# Patient Record
Sex: Male | Born: 1935 | Race: White | Hispanic: No | Marital: Married | Smoking: Never smoker
Health system: Southern US, Community
[De-identification: ages and names within clinical notes are randomized; demographics above are authoritative.]

## PROBLEM LIST (undated history)

## (undated) DIAGNOSIS — R972 Elevated prostate specific antigen [PSA]: Secondary | ICD-10-CM

## (undated) HISTORY — DX: Elevated prostate specific antigen (PSA): R97.20

---
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
## 1978-05-08 HISTORY — PX: BACK SURGERY: SHX140

## 2006-05-08 HISTORY — PX: HERNIA REPAIR: SHX51

## 2009-09-07 ENCOUNTER — Ambulatory Visit: Payer: Self-pay | Admitting: General Surgery

## 2009-09-20 ENCOUNTER — Ambulatory Visit: Payer: Self-pay | Admitting: General Surgery

## 2010-11-14 ENCOUNTER — Ambulatory Visit: Payer: Self-pay | Admitting: Gastroenterology

## 2011-11-16 ENCOUNTER — Encounter (INDEPENDENT_AMBULATORY_CARE_PROVIDER_SITE_OTHER): Payer: Self-pay

## 2012-11-19 ENCOUNTER — Ambulatory Visit: Payer: Self-pay | Admitting: Surgery

## 2012-11-19 LAB — CBC WITH DIFFERENTIAL/PLATELET
Basophil #: 0.1 10*3/uL (ref 0.0–0.1)
Basophil %: 0.8 %
Eosinophil #: 0.2 10*3/uL (ref 0.0–0.7)
Eosinophil %: 3.2 %
HCT: 42.1 % (ref 40.0–52.0)
HGB: 14.3 g/dL (ref 13.0–18.0)
Lymphocyte #: 1.7 10*3/uL (ref 1.0–3.6)
MCV: 92 fL (ref 80–100)
Monocyte #: 0.8 x10 3/mm (ref 0.2–1.0)
Monocyte %: 10.5 %
Neutrophil #: 4.5 10*3/uL (ref 1.4–6.5)
Neutrophil %: 61.9 %
RBC: 4.56 10*6/uL (ref 4.40–5.90)
RDW: 13.6 % (ref 11.5–14.5)
WBC: 7.2 10*3/uL (ref 3.8–10.6)

## 2012-11-19 LAB — COMPREHENSIVE METABOLIC PANEL
Bilirubin,Total: 0.5 mg/dL (ref 0.2–1.0)
Calcium, Total: 9.3 mg/dL (ref 8.5–10.1)
Chloride: 105 mmol/L (ref 98–107)
Co2: 31 mmol/L (ref 21–32)
EGFR (Non-African Amer.): 49 — ABNORMAL LOW
Glucose: 81 mg/dL (ref 65–99)
Potassium: 4.6 mmol/L (ref 3.5–5.1)

## 2012-11-22 ENCOUNTER — Ambulatory Visit (INDEPENDENT_AMBULATORY_CARE_PROVIDER_SITE_OTHER): Payer: Self-pay | Admitting: General Surgery

## 2012-11-27 ENCOUNTER — Ambulatory Visit: Payer: Self-pay | Admitting: Surgery

## 2013-10-24 ENCOUNTER — Ambulatory Visit: Payer: Self-pay | Admitting: Gastroenterology

## 2014-08-28 NOTE — H&P (Signed)
PATIENT NAME:  Timothy Robbins, Timothy Robbins MR#:  [id number] DATE OF BIRTH:  [birth year]  DATE OF OPERATION:  11/27/2012  CHIEF COMPLAINT: Left groin pain.   HISTORY OF PRESENT ILLNESS: This is a patient who had a prior left inguinal hernia repaired via an open procedure in 2011. He has had groin pain and bulge since then. He has no right-sided symptoms. He had a right-sided repair in the 80s and has no problems since.   PAST MEDICAL HISTORY: None.   PAST SURGICAL HISTORY: Inguinal hernia repairs bilateral as above, back surgery and appendectomy.   MEDICATIONS: None.   ALLERGIES: None.   FAMILY HISTORY: Noncontributory.   SOCIAL HISTORY: The patient does not smoke, rarely drinks alcohol.  REVIEW OF SYSTEMS:  10-system review has been performed and negative with the exception of that mentioned in the HPI.  PHYSICAL EXAMINATION: GENERAL: Healthy-appearing Caucasian male patient.  HEENT: Shows no scleral icterus.  NECK: No palpable neck nodes.  CHEST: Clear to auscultation.  CARDIAC: Regular rate and rhythm.  ABDOMEN: Soft, nontender.  EXTREMITIES: Without edema.  NEUROLOGIC: Grossly intact.  INTEGUMENT: Shows no jaundice.  GENITOURINARY: Demonstrates normal testicles bilaterally, scar in both groins. Recurrent left inguinal hernia is noted, which is reducible and nontender.  LABORATORY VALUES:  Within normal limits.   ASSESSMENT AND PLAN: This is a patient with a left inguinal hernia which is a recurrence. I have recommended laparoscopic preperitoneal repair. The rationale for this approach has been discussed. The options of observation have been reviewed and the risks of bleeding, infection, recurrence, ischemic orchitis, chronic pain syndrome and neuroma were all reviewed with him. He understood and agreed to proceed.   ____________________________ Adah Salvageichard E. Excell Seltzerooper, MD rec:ce Robbins: 11/27/2012 10:56:48 ET T: 11/27/2012 11:22:54 ET JOB#: [id number]  cc: Adah Salvageichard E. Excell Seltzerooper, MD,  <Dictator> Lattie HawICHARD E COOPER MD ELECTRONICALLY SIGNED 12/02/2012 19:04

## 2016-07-09 ENCOUNTER — Emergency Department
Admission: EM | Admit: 2016-07-09 | Discharge: 2016-07-09 | Disposition: A | Discharge status: Home / Self Care | Payer: Medicare Other | Attending: Student in an Organized Health Care Education/Training Program | Admitting: Student in an Organized Health Care Education/Training Program

## 2016-07-09 ENCOUNTER — Emergency Department: Payer: Medicare Other

## 2016-07-09 ENCOUNTER — Encounter: Payer: Self-pay | Admitting: Emergency Medicine

## 2016-07-09 DIAGNOSIS — M898X8 Other specified disorders of bone, other site: Secondary | ICD-10-CM

## 2016-07-09 DIAGNOSIS — M25552 Pain in left hip: Secondary | ICD-10-CM | POA: Insufficient documentation

## 2016-07-09 LAB — URINALYSIS, COMPLETE (UACMP) WITH MICROSCOPIC
Bilirubin Urine: NEGATIVE
GLUCOSE, UA: NEGATIVE mg/dL
Hgb urine dipstick: NEGATIVE
KETONES UR: NEGATIVE mg/dL
LEUKOCYTES UA: NEGATIVE
Nitrite: NEGATIVE
PH: 5 (ref 5.0–8.0)
PROTEIN: NEGATIVE mg/dL
SQUAMOUS EPITHELIAL / LPF: NONE SEEN
Specific Gravity, Urine: 1.017 (ref 1.005–1.030)

## 2016-07-09 IMAGING — CR DG LUMBAR SPINE 2-3V
3 series · 3 of 3 positions shown · non-contrast
Comparison: None.

CLINICAL DATA: Pt states he began to have LT side pain at LT iliac
crest 3 days ago with no injury; pt states a hx/o of back surgery

EXAM:
LUMBAR SPINE - 2-3 VIEW

[l-spine ap]
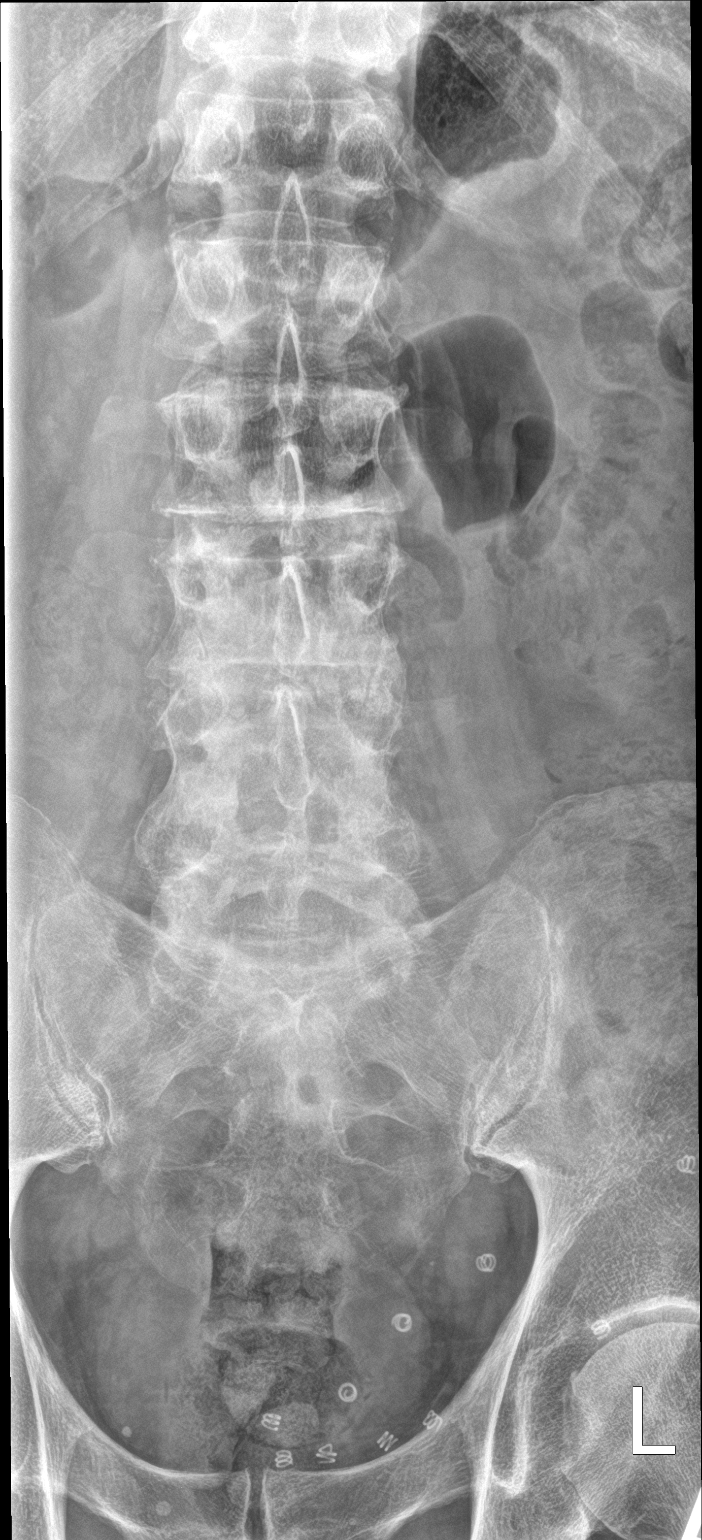

[l-spine lat]
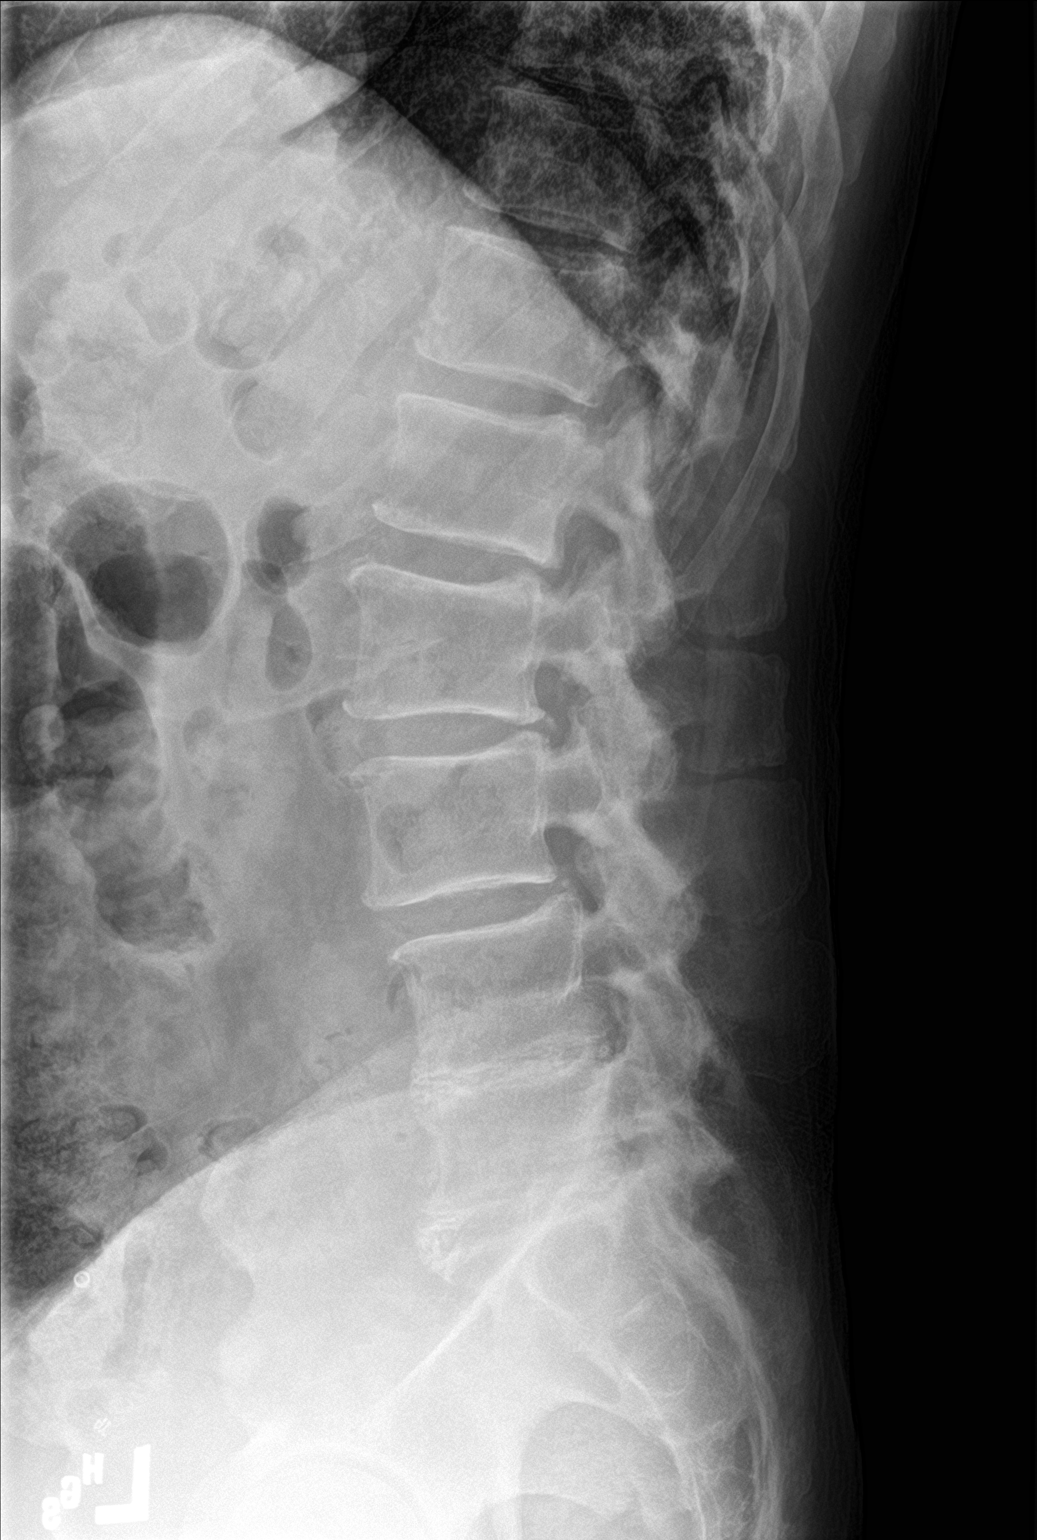

[l-spine spot]
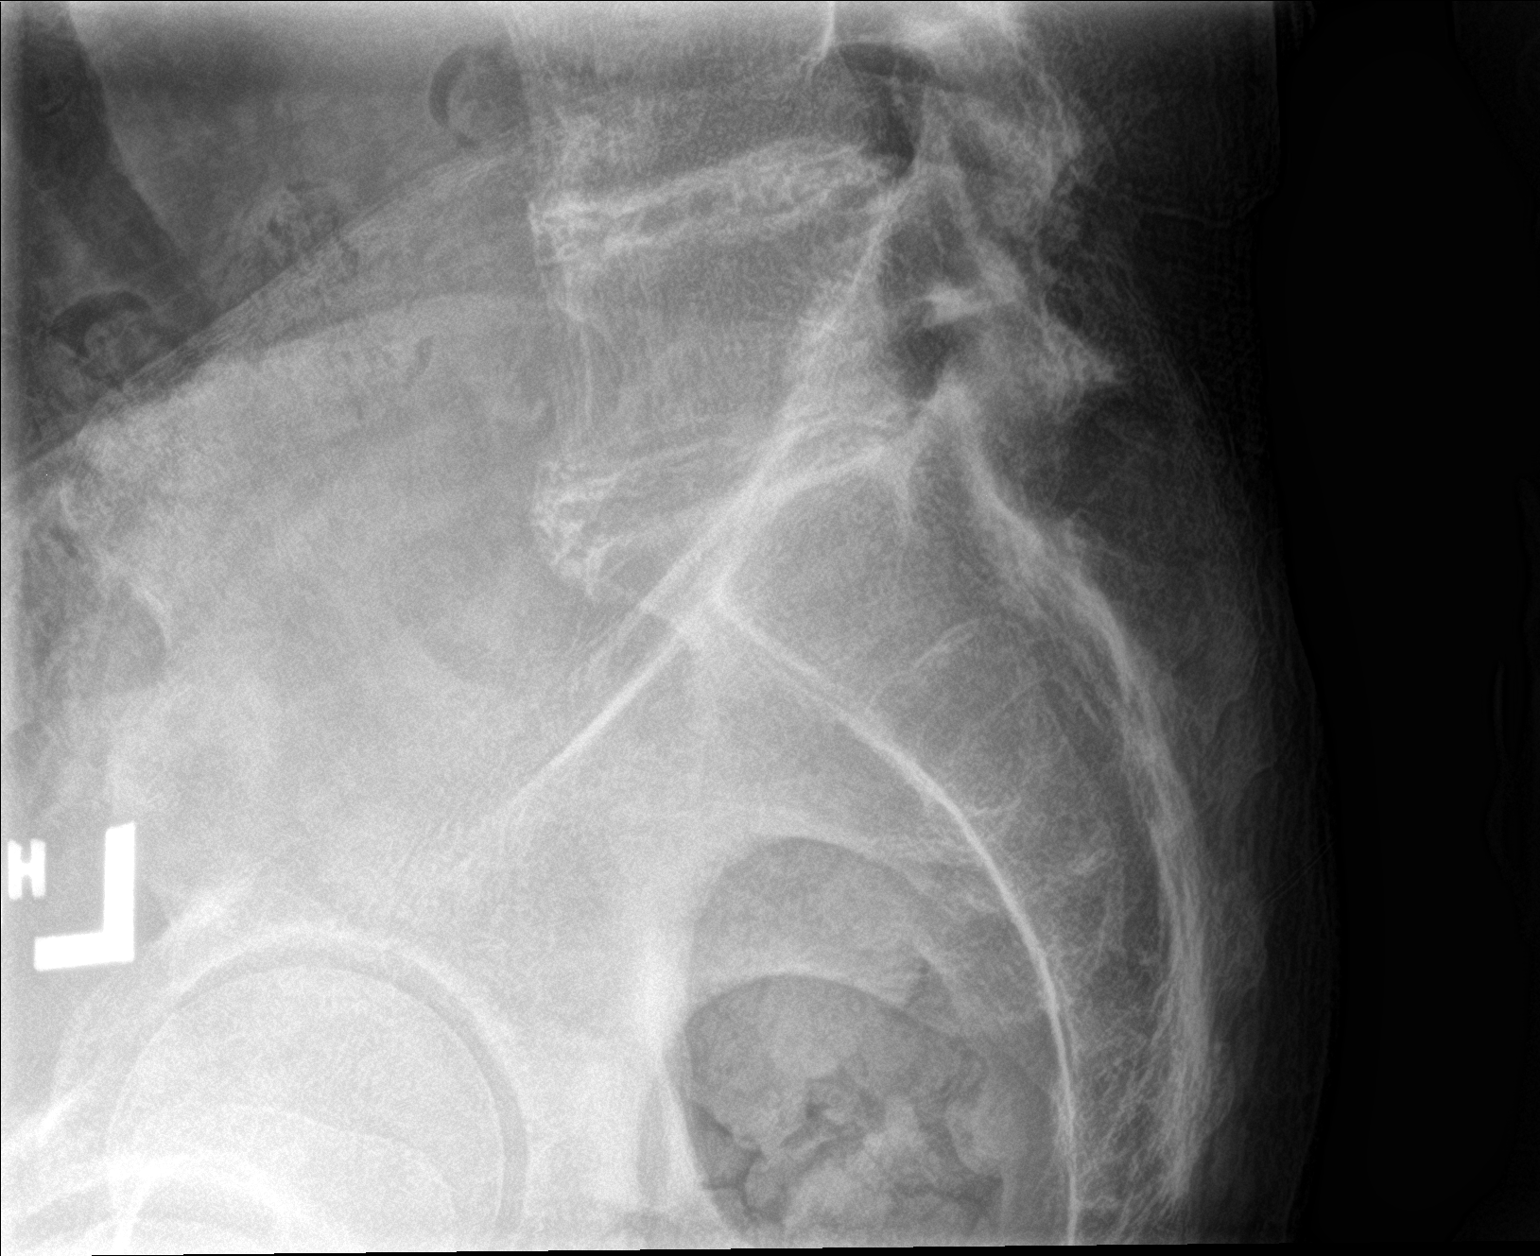

[3 of 3 positions shown; findings below may reference images not displayed]

FINDINGS: Degenerative disc desiccations are seen at the L4-5 and L5-S1
levels, with significant disc space loss, at least some degree of
degenerative ankylosis, and osseous spurring. There is additional
degenerative facet hypertrophy at these levels causing at least some
degree of osseous neural foramen narrowing.

Slight anterolisthesis of L3 on L4 related to the underlying
degenerative change. Alignment is otherwise within normal limits. No
fracture line or displaced fracture fragment identified. No acute or
suspicious osseous lesion. Paravertebral soft tissues are
unremarkable.
IMPRESSION: 1. No acute findings.
2. Degenerative changes in the lower lumbar spine, at least moderate
in degree, as detailed above.

## 2016-07-09 MED ORDER — CYCLOBENZAPRINE HCL 10 MG PO TABS
5.0000 mg | ORAL_TABLET | Freq: Once | ORAL | Status: AC
  Administered 2016-07-09: 5 mg via ORAL
  Filled 2016-07-09: qty 1

## 2016-07-09 MED ORDER — TRAMADOL HCL 50 MG PO TABS
50.0000 mg | ORAL_TABLET | Freq: Four times a day (QID) | ORAL | 0 refills | Status: DC | PRN
Start: 2016-07-09 — End: 2016-11-07

## 2016-07-09 MED ORDER — ACETAMINOPHEN 500 MG PO TABS
1000.0000 mg | ORAL_TABLET | Freq: Once | ORAL | Status: AC
  Administered 2016-07-09: 1000 mg via ORAL
  Filled 2016-07-09: qty 2

## 2016-07-09 MED ORDER — DICLOFENAC SODIUM 1 % TD GEL: 2.0000 g | Freq: Four times a day (QID) | TRANSDERMAL | 0 refills | Status: DC

## 2016-07-09 NOTE — Discharge Instructions (Signed)

## 2016-07-09 NOTE — ED Provider Notes (Addendum)
[facility] Emergency Department Provider Note    First MD Initiated Contact with Patient 07/09/16 1149     (approximate)  I have reviewed the triage vital signs and the nursing notes.   HISTORY  Chief Complaint Flank Pain    HPI Elenor LegatoJames D Myles is a 83 y.o. male several days of left sided pain just below his iliac crest. Pain is worse with movement. Went to see his primary doctor about this several days ago. Has not had any worsening of pain but was acting the pain is resolved by now. Denies any numbness or tingling. No diarrhea. No saddle anesthesia. No hematuria. No high flank pain. No nausea or vomiting. No abdominal pain. No trauma.  Denies any swelling. As the pain is worse with trying to get up out of bed. Rates it as a 5 out of 10 in severity. States he woke up this morning took some of his wife's pain medications with improvement in his symptoms.   History reviewed. No pertinent past medical history. History reviewed. No pertinent family history. Past Surgical History:  Procedure Laterality Date  . APPENDECTOMY    . BACK SURGERY     There are no active problems to display for this patient.     Prior to Admission medications   Medication Sig Start Date End Date Taking? Authorizing Provider  diclofenac sodium (VOLTAREN) 1 % GEL Apply 2 g topically 4 (four) times daily. 07/09/16   Willy EddyPatrick Winthrop Shannahan, MD  traMADol (ULTRAM) 50 MG tablet Take 1 tablet (50 mg total) by mouth every 6 (six) hours as needed. 07/09/16 07/09/17  Willy EddyPatrick Lizvet Chunn, MD    Allergies Patient has no known allergies.    Social History Social History  Substance Use Topics  . Smoking status: Never Smoker  . Smokeless tobacco: Never Used  . Alcohol use Not on file    Review of Systems Patient denies headaches, rhinorrhea, blurry vision, numbness, shortness of breath, chest pain, edema, cough, abdominal pain, nausea, vomiting, diarrhea, dysuria, fevers, rashes or  hallucinations unless otherwise stated above in HPI. ____________________________________________   PHYSICAL EXAM:  VITAL SIGNS: Vitals:   07/09/16 1007  BP: 130/77  Pulse: (!) 58  Resp: 18  Temp: 97.6 F (36.4 C)    Constitutional: Alert and oriented. Well appearing and in no acute distress. Eyes: Conjunctivae are normal. PERRL. EOMI. Head: Atraumatic. Nose: No congestion/rhinnorhea. Mouth/Throat: Mucous membranes are moist.  Oropharynx non-erythematous. Neck: No stridor. Painless ROM. No cervical spine tenderness to palpation Hematological/Lymphatic/Immunilogical: No cervical lymphadenopathy. Cardiovascular: Normal rate, regular rhythm. Grossly normal heart sounds.  Good peripheral circulation. Respiratory: Normal respiratory effort.  No retractions. Lungs CTAB. Gastrointestinal: Soft and nontender. No distention. No abdominal bruits. No palpable mass. No CVA tenderness. Musculoskeletal: ttp lateral and inferior to posterior iliac crest near insertion of gluteus medius. No cellulitis, erythema, fluctuant mass or deformity.  No lower extremity tenderness nor edema.  No joint effusions. Neurologic:  Normal speech and language. No gross focal neurologic deficits are appreciated. No gait instability. Skin:  Skin is warm, dry and intact. No rash noted. Psychiatric: Mood and affect are normal. Speech and behavior are normal.  ____________________________________________   LABS (all labs ordered are listed, but only abnormal results are displayed)  Results for orders placed or performed during the hospital encounter of 07/09/16 (from the past 24 hour(s))  Urinalysis, Complete w Microscopic     Status: Abnormal   Collection Time: 07/09/16 10:34 AM  Result Value Ref Range  Color, Urine YELLOW (A) YELLOW   APPearance CLEAR (A) CLEAR   Specific Gravity, Urine 1.017 1.005 - 1.030   pH 5.0 5.0 - 8.0   Glucose, UA NEGATIVE NEGATIVE mg/dL   Hgb urine dipstick NEGATIVE NEGATIVE    Bilirubin Urine NEGATIVE NEGATIVE   Ketones, ur NEGATIVE NEGATIVE mg/dL   Protein, ur NEGATIVE NEGATIVE mg/dL   Nitrite NEGATIVE NEGATIVE   Leukocytes, UA NEGATIVE NEGATIVE   RBC / HPF 0-5 0 - 5 RBC/hpf   WBC, UA 0-5 0 - 5 WBC/hpf   Bacteria, UA RARE (A) NONE SEEN   Squamous Epithelial / LPF NONE SEEN NONE SEEN   Mucous PRESENT    ____________________________________________  EKG______________________________  RADIOLOGY  I personally reviewed all radiographic images ordered to evaluate for the above acute complaints and reviewed radiology reports and findings.  These findings were personally discussed with the patient.  Please see medical record for radiology report.  ____________________________________________   PROCEDURES  Procedure(s) performed:  Procedures    Critical Care performed: no ____________________________________________   INITIAL IMPRESSION / ASSESSMENT AND PLAN / ED COURSE  Pertinent labs & imaging results that were available during my care of the patient were reviewed by me and considered in my medical decision making (see chart for details).  DDX: msk strain, si, stone, AAA  RIO KIDANE is a 83 y.o. who presents to the ED p/w left hip/iliac pain as described above.  No fevers, no systemic symptoms. - urinary symptoms. Denies trauma or injury. Afebrile in ED. Exam as above. Flank non- TTP, otherwise abdominal exam is benign. No peritoneal signs. No evidence of AAA or dissection. Possible kidney stone, cystitis, or pyelonephritis.  Checking urine. UA without hematuria of infection.  Given point tenderness as described above and concern for MSK injury, Plain films of pelvis and L spine ordered due to concern for fracture or pathologic lesion.  No evidence of fracture.  Clinical picture is not consistent with, diverticulitis, pancreatitis, cholecystitis, bowel perforation, aortic dissection, splenic injury or acute abdominal process at this time.  Pain  improved, tolerating PO. No evidence of acute neurologic deficits.  Patient was able to tolerate PO and was able to ambulate with a steady gait. Repeat ABD exam benign, will plan supportive treatment and early follow up for recheck.  Have discussed with the patient and available family all diagnostics and treatments performed thus far and all questions were answered to the best of my ability. The patient demonstrates understanding and agreement with plan.        ____________________________________________   FINAL CLINICAL IMPRESSION(S) / ED DIAGNOSES  Final diagnoses:  Iliac crest bone pain      NEW MEDICATIONS STARTED DURING THIS VISIT:  New Prescriptions   DICLOFENAC SODIUM (VOLTAREN) 1 % GEL    Apply 2 g topically 4 (four) times daily.   TRAMADOL (ULTRAM) 50 MG TABLET    Take 1 tablet (50 mg total) by mouth every 6 (six) hours as needed.     Note:  This document was prepared using Dragon voice recognition software and may include unintentional dictation errors.    Willy Eddy, MD 07/09/16 1310    Willy Eddy, MD 07/09/16 [phone]

## 2016-07-09 NOTE — ED Triage Notes (Signed)
x3 days of left flank pain, no urinary symptoms,

## 2016-11-06 NOTE — Progress Notes (Signed)
11/07/2016 12:24 AM   Timothy Robbins 11-16-1933 [id number]  Referring provider: Jerl Mina, MD [address] [facility] [place], [place] [address]  Chief Complaint  Patient presents with  . Urinary Frequency    New Patient    HPI: Patient is an 83 year old Caucasian male who is referred by Zelphia Cairo PA-C for increase in urinary frequency,  BPH WITH LUTS  (prostate and/or bladder) His IPSS score today is 6, which is mild lower urinary tract symptomatology. He is mixed with his quality life due to his urinary symptoms. His PVR is 59 mL.    His major complaints today are frequency x 3-4, urgency and nocturia x 2.   He has had these symptoms for the last year.  He denies any dysuria, hematuria or suprapubic pain.   PSA History  1.23 ng/mL in 10/2014  2.10 ng/mL in 10/2015     He also denies any recent fevers, chills, nausea or vomiting.      IPSS    Row Name 11/07/16 1400         International Prostate Symptom Score   How often have you had the sensation of not emptying your bladder? Not at All     How often have you had to urinate less than every two hours? Less than 1 in 5 times     How often have you found you stopped and started again several times when you urinated? Less than 1 in 5 times     How often have you found it difficult to postpone urination? Less than 1 in 5 times     How often have you had a weak urinary stream? Less than 1 in 5 times     How often have you had to strain to start urination? Not at All     How many times did you typically get up at night to urinate? 2 Times     Total IPSS Score 6       Quality of Life due to urinary symptoms   If you were to spend the rest of your life with your urinary condition just the way it is now how would you feel about that? Mixed        Score:  1-7 Mild 8-19 Moderate 20-35 Severe    PMH: No past medical history on file.  Surgical History: Past Surgical History:  Procedure  Laterality Date  . APPENDECTOMY    . BACK SURGERY      Home Medications:  Allergies as of 11/07/2016   No Known Allergies     Medication List       Accurate as of 11/07/16 11:59 PM. Always use your most recent med list.          naproxen 500 MG tablet Commonly known as:  NAPROSYN Take by mouth.   tamsulosin 0.4 MG Caps capsule Commonly known as:  FLOMAX Take 1 capsule (0.4 mg total) by mouth daily.       Allergies: No Known Allergies  Family History: Family History  Problem Relation Age of Onset  . Bladder Cancer Neg Hx   . Kidney cancer Neg Hx   . Prostate cancer Neg Hx     Social History:  reports that he has never smoked. He has never used smokeless tobacco. He reports that he drinks alcohol. He reports that he does not use drugs.  ROS: UROLOGY Frequent Urination?: Yes Hard to postpone urination?: Yes Burning/pain with urination?:  No Get up at night to urinate?: Yes Leakage of urine?: No Urine stream starts and stops?: No Trouble starting stream?: No Do you have to strain to urinate?: No Blood in urine?: No Urinary tract infection?: No Sexually transmitted disease?: No Injury to kidneys or bladder?: No Painful intercourse?: No Weak stream?: No Erection problems?: No Penile pain?: No  Gastrointestinal Nausea?: No Vomiting?: No Indigestion/heartburn?: No Diarrhea?: No Constipation?: No  Constitutional Fever: No Night sweats?: No Weight loss?: No Fatigue?: No  Skin Skin rash/lesions?: No Itching?: No  Eyes Blurred vision?: No Double vision?: No  Ears/Nose/Throat Sore throat?: No Sinus problems?: No  Hematologic/Lymphatic Swollen glands?: No Easy bruising?: No  Cardiovascular Leg swelling?: No Chest pain?: No  Respiratory Cough?: No Shortness of breath?: No  Endocrine Excessive thirst?: No  Musculoskeletal Back pain?: No Joint pain?: No  Neurological Headaches?: No Dizziness?: No  Psychologic Anxiety?:  No  Physical Exam: BP (!) 154/84   Pulse (!) 59   Ht 6\' 1"  (1.854 m)   Wt 180 lb (81.6 kg)   BMI 23.75 kg/m   Constitutional: Well nourished. Alert and oriented, No acute distress. HEENT: [place] AT, moist mucus membranes. Trachea midline, no masses. Cardiovascular: No clubbing, cyanosis, or edema. Respiratory: Normal respiratory effort, no increased work of breathing. GI: Abdomen is soft, non tender, non distended, no abdominal masses. Liver and spleen not palpable.  No hernias appreciated.  Stool sample for occult testing is not indicated.   GU: No CVA tenderness.  No bladder fullness or masses.  Patient with uncircumcised phallus. Foreskin easily retracted.  Beginning of paraphimosis that is easily reduced.  Urethral meatus is patent.  No penile discharge. No penile lesions or rashes. Scrotum without lesions, cysts, rashes and/or edema.  Testicles are located scrotally bilaterally. No masses are appreciated in the testicles. Left and right epididymis are normal. Rectal: Patient with  normal sphincter tone. Anus and perineum without scarring or rashes. No rectal masses are appreciated. Prostate is approximately 55 grams, no nodules are appreciated. Seminal vesicles are normal. Skin: No rashes, bruises or suspicious lesions. Lymph: No cervical or inguinal adenopathy. Neurologic: Grossly intact, no focal deficits, moving all 4 extremities. Psychiatric: Normal mood and affect.  Laboratory Data: Serum creatinine 1.3 in 12/2015  I have independently reviewed the labs.   Pertinent Imaging: Results for Timothy LegatoMCCAULEY, Timothy D (MRN [id number]) as of 11/07/2016 14:44  Ref. Range 11/07/2016 14:25  Scan Result Unknown 59ml   I have independently reviewed the films.    Assessment & Plan:    1. BPH with LUTS  - IPSS score is 6/3  - Continue conservative management, avoiding bladder irritants and timed voiding's  - most bothersome symptoms is/are frequency, urgency and nocturia  - Initiate  alpha-blocker (tamsulosin), discussed side effects   - RTC in one month for IPS'S and PVR   2. Nocturia  - I explained to the patient that nocturia is often multi-factorial and difficult to treat.  Sleeping disorders, heart conditions, peripheral vascular disease, diabetes, an enlarged prostate for men, an urethral stricture causing bladder outlet obstruction and/or certain medications can contribute to nocturia.  - The patient may benefit from a discussion with his or her primary care physician to see if he or she has risk factors for sleep apnea or other sleep disturbances and obtaining a sleep study - patient does not believe he has sleep apnea  - reassess in one month   Return in about 1 month (around 12/08/2016) for IPSS and PVR.  These notes generated with voice recognition software. I apologize for typographical errors.  Zara Council, [facility] [address], [place], [place] [address] [phone]

## 2016-11-07 ENCOUNTER — Encounter: Payer: Self-pay | Admitting: Urology

## 2016-11-07 ENCOUNTER — Ambulatory Visit (INDEPENDENT_AMBULATORY_CARE_PROVIDER_SITE_OTHER): Payer: Medicare Other | Admitting: Urology

## 2016-11-07 VITALS — BP 154/84 | HR 59 | Ht 73.0 in | Wt 180.0 lb

## 2016-11-07 DIAGNOSIS — R351 Nocturia: Secondary | ICD-10-CM | POA: Diagnosis not present

## 2016-11-07 DIAGNOSIS — R350 Frequency of micturition: Secondary | ICD-10-CM | POA: Diagnosis not present

## 2016-11-07 DIAGNOSIS — N138 Other obstructive and reflux uropathy: Secondary | ICD-10-CM | POA: Diagnosis not present

## 2016-11-07 DIAGNOSIS — N401 Enlarged prostate with lower urinary tract symptoms: Secondary | ICD-10-CM

## 2016-11-07 LAB — BLADDER SCAN AMB NON-IMAGING

## 2016-11-07 MED ORDER — TAMSULOSIN HCL 0.4 MG PO CAPS: 0.4000 mg | ORAL_CAPSULE | Freq: Every day | ORAL | 3 refills | Status: DC

## 2016-11-08 ENCOUNTER — Telehealth: Payer: Self-pay | Admitting: Urology

## 2016-11-08 NOTE — Telephone Encounter (Signed)
Please send my note to UAL CorporationDaniella Carter PA-C.

## 2016-11-09 NOTE — Telephone Encounter (Signed)
Note sent in Epic

## 2016-12-06 NOTE — Progress Notes (Signed)
12/07/2016 10:10 AM   Timothy Robbins 02-21-1934 [id number]  Referring provider: Jerl MinaHedrick, Whitt, MD [address] [facility] [place], [place] [id number]  Chief Complaint  Patient presents with  . Benign Prostatic Hypertrophy    1 month follow up  . Nocturia    HPI: Patient is an 83 year old Caucasian male who presents today for a one month follow up for BPH with LU TS and nocturia after starting tamsulosin.    BPH WITH LUTS  (prostate and/or bladder) His IPSS score today is 5, which is mild lower urinary tract symptomatology.  He is mostly dissatisfied with his quality life due to his urinary symptoms.  His PVR is 0 mL.  His previous I PSS score was 6/3.  His previous PVR was 59 mL.  His major complaint(s) today is/are frequency, urgency and nocturia.  He has had these symptoms for the last year  He denies any dysuria, hematuria or suprapubic pain.   He also denies any recent fevers, chills, nausea or vomiting.      IPSS    Row Name 11/07/16 1400 12/07/16 0900       International Prostate Symptom Score   How often have you had the sensation of not emptying your bladder? Not at All Not at All    How often have you had to urinate less than every two hours? Less than 1 in 5 times Less than 1 in 5 times    How often have you found you stopped and started again several times when you urinated? Less than 1 in 5 times Less than 1 in 5 times    How often have you found it difficult to postpone urination? Less than 1 in 5 times Less than 1 in 5 times    How often have you had a weak urinary stream? Less than 1 in 5 times Not at All    How often have you had to strain to start urination? Not at All Not at All    How many times did you typically get up at night to urinate? 2 Times 2 Times    Total IPSS Score 6 5      Quality of Life due to urinary symptoms   If you were to spend the rest of your life with your urinary condition just the way it is now how would you feel about  that? Mixed Mostly Disatisfied       Score:  1-7 Mild 8-19 Moderate 20-35 Severe  Nocturia Patient put mostly dissatisfied on his I PSS score due to nocturia x 2.  He is still not interested in a sleep study.  He would like to continue the tamsulosin for three more months.    PMH: No past medical history on file.  Surgical History: Past Surgical History:  Procedure Laterality Date  . APPENDECTOMY    . BACK SURGERY      Home Medications:  Allergies as of 12/07/2016   No Known Allergies     Medication List       Accurate as of 12/07/16 10:10 AM. Always use your most recent med list.          tamsulosin 0.4 MG Caps capsule Commonly known as:  FLOMAX Take 1 capsule (0.4 mg total) by mouth daily.       Allergies: No Known Allergies  Family History: Family History  Problem Relation Age of Onset  . Bladder Cancer Neg Hx   . Kidney  cancer Neg Hx   . Prostate cancer Neg Hx     Social History:  reports that he has never smoked. He has never used smokeless tobacco. He reports that he drinks alcohol. He reports that he does not use drugs.  ROS: UROLOGY Frequent Urination?: Yes Hard to postpone urination?: Yes Burning/pain with urination?: No Get up at night to urinate?: Yes Leakage of urine?: No Urine stream starts and stops?: No Trouble starting stream?: No Do you have to strain to urinate?: No Blood in urine?: No Urinary tract infection?: No Sexually transmitted disease?: No Injury to kidneys or bladder?: No Painful intercourse?: No Weak stream?: No Erection problems?: No Penile pain?: No  Gastrointestinal Nausea?: No Vomiting?: No Indigestion/heartburn?: No Diarrhea?: No Constipation?: No  Constitutional Fever: No Night sweats?: No Weight loss?: No Fatigue?: No  Skin Skin rash/lesions?: No Itching?: No  Eyes Blurred vision?: No Double vision?: No  Ears/Nose/Throat Sore throat?: No Sinus problems?: No  Hematologic/Lymphatic Swollen  glands?: No Easy bruising?: No  Cardiovascular Leg swelling?: No Chest pain?: No  Respiratory Cough?: No Shortness of breath?: No  Endocrine Excessive thirst?: No  Musculoskeletal Back pain?: No Joint pain?: No  Neurological Headaches?: No Dizziness?: No  Psychologic Depression?: No Anxiety?: No  Physical Exam: BP 134/73   Pulse (!) 56   Ht 5\' 11"  (1.803 m)   Wt 180 lb 9.6 oz (81.9 kg)   BMI 25.19 kg/m   Constitutional: Well nourished. Alert and oriented, No acute distress. HEENT: [place] AT, moist mucus membranes. Trachea midline, no masses. Cardiovascular: No clubbing, cyanosis, or edema. Respiratory: Normal respiratory effort, no increased work of breathing. Skin: No rashes, bruises or suspicious lesions. Lymph: No cervical or inguinal adenopathy. Neurologic: Grossly intact, no focal deficits, moving all 4 extremities. Psychiatric: Normal mood and affect.  Laboratory Data: Serum creatinine 1.3 in 12/2015 PSA History  1.23 ng/mL in 10/2014  2.10 ng/mL in 10/2015  I have independently reviewed the labs.  Pertinent Imaging: Results for Timothy LegatoMCCAULEY, Timothy D (MRN [id number]) as of 12/07/2016 10:07  Ref. Range 12/07/2016 09:32  Scan Result Unknown 0    I have independently reviewed the films.    Assessment & Plan:    1. BPH with LUTS  - IPSS score is 6/3  - Continue conservative management, avoiding bladder irritants and timed voiding's  - most bothersome symptoms is/are frequency, urgency and nocturia  - continue tamsulosin 0.4 mg daily  - RTC in one month for I PSS and PVR   2. Nocturia  - I explained to the patient that nocturia is often multi-factorial and difficult to treat.  Sleeping disorders, heart conditions, peripheral vascular disease, diabetes, an enlarged prostate for men, an urethral stricture causing bladder outlet obstruction and/or certain medications can contribute to nocturia.  - The patient may benefit from a discussion with his or her  primary care physician to see if he or she has risk factors for sleep apnea or other sleep disturbances and obtaining a sleep study - patient does not believe he has sleep apnea     Return in about 3 months (around 03/09/2017) for IPSS and PVR.  These notes generated with voice recognition software. I apologize for typographical errors.  Michiel CowboySHANNON Serrita Lueth, PA-C  [facility] [address], [address] [place], [place] [address] [phone]

## 2016-12-07 ENCOUNTER — Ambulatory Visit: Payer: Medicare Other | Admitting: Urology

## 2016-12-07 ENCOUNTER — Encounter: Payer: Self-pay | Admitting: Urology

## 2016-12-07 VITALS — BP 134/73 | HR 56 | Ht 71.0 in | Wt 180.6 lb

## 2016-12-07 DIAGNOSIS — R351 Nocturia: Secondary | ICD-10-CM

## 2016-12-07 DIAGNOSIS — N401 Enlarged prostate with lower urinary tract symptoms: Secondary | ICD-10-CM | POA: Diagnosis not present

## 2016-12-07 DIAGNOSIS — N138 Other obstructive and reflux uropathy: Secondary | ICD-10-CM

## 2016-12-07 LAB — BLADDER SCAN AMB NON-IMAGING: Scan Result: 0

## 2017-02-23 ENCOUNTER — Emergency Department
Admission: EM | Admit: 2017-02-23 | Discharge: 2017-02-23 | Disposition: A | Discharge status: Home / Self Care | Payer: Medicare Other | Attending: Emergency Medicine | Admitting: Emergency Medicine

## 2017-02-23 ENCOUNTER — Encounter: Payer: Self-pay | Admitting: Emergency Medicine

## 2017-02-23 DIAGNOSIS — R51 Headache: Secondary | ICD-10-CM | POA: Diagnosis present

## 2017-02-23 DIAGNOSIS — M542 Cervicalgia: Secondary | ICD-10-CM

## 2017-02-23 DIAGNOSIS — Z79899 Other long term (current) drug therapy: Secondary | ICD-10-CM | POA: Insufficient documentation

## 2017-02-23 DIAGNOSIS — G44209 Tension-type headache, unspecified, not intractable: Secondary | ICD-10-CM | POA: Diagnosis not present

## 2017-02-23 MED ORDER — OXYCODONE-ACETAMINOPHEN 5-325 MG PO TABS
1.0000 | ORAL_TABLET | Freq: Once | ORAL | Status: AC
  Administered 2017-02-23: 1 via ORAL
  Filled 2017-02-23: qty 1

## 2017-02-23 MED ORDER — HYDROCODONE-ACETAMINOPHEN 5-325 MG PO TABS: 1.0000 | ORAL_TABLET | Freq: Four times a day (QID) | ORAL | 0 refills | Status: DC | PRN

## 2017-02-23 NOTE — Discharge Instructions (Signed)
[facility] [place] if any continued problems. Moist heat or ice to your shoulders especially on the left side as needed for soreness and stiffness. Norco one every 6 hours as needed for pain. Do not drive while taking this medication as it could cause drowsiness and it may also increase your risk for falling. Return to the emergency room if any severe worsening of your symptoms.

## 2017-02-23 NOTE — ED Triage Notes (Signed)
Patient complaining of neck and head pain left side, states he went "eye Dr" on Tuesday and started having pain that afternoon "I don't think it was anything that happened at the eye Dr's".  States that the use of heat helps the pain.  Takes medication "the urologist gave me to prevent me from going to the bathroom too often".  Radial pulse 56 and regular.  Holds neck still - states it hurts to move his neck.  Grip strength equal.

## 2017-02-23 NOTE — ED Provider Notes (Signed)
[facility] Emergency Department Provider Note  ____________________________________________   First MD Initiated Contact with Patient 02/23/17 [phone]     (approximate)  I have reviewed the triage vital signs and the nursing notes.   HISTORY  Chief Complaint Headache   HPI Timothy Robbins is a 83 y.o. male is here complaining of neck pain on the left side resulting in a headache that is generalized. Patient states this gradually began to hurt and has been unrelieved by any medication.he reports that he woke up like this and that there is been no injury recently to his neck. He states that he went to the ophthalmologist 3 days ago but does not recall his neck hurting at that time. Patient states the pain is increased with movement of his neck and that his range of motion has been restricted due to this. He denies any visual changes, fever, chills, nausea or vomiting. He denies any dizziness, paresthesias. Patient has continued to be ambulatory without any assistance.patient rates his pain is 7 out of 10.  History reviewed. No pertinent past medical history.  There are no active problems to display for this patient.   Past Surgical History:  Procedure Laterality Date  . APPENDECTOMY    . BACK SURGERY      Prior to Admission medications   Medication Sig Start Date End Date Taking? Authorizing Provider  HYDROcodone-acetaminophen (NORCO/VICODIN) 5-325 MG tablet Take 1 tablet by mouth every 6 (six) hours as needed for moderate pain. 02/23/17   Tommi Rumps, PA-C  tamsulosin (FLOMAX) 0.4 MG CAPS capsule Take 1 capsule (0.4 mg total) by mouth daily. 11/07/16   Michiel Cowboy A, PA-C    Allergies Patient has no known allergies.  Family History  Problem Relation Age of Onset  . Bladder Cancer Neg Hx   . Kidney cancer Neg Hx   . Prostate cancer Neg Hx     Social History Social History  Substance Use Topics  . Smoking status: Never Smoker  .  Smokeless tobacco: Never Used  . Alcohol use Yes    Review of Systems Constitutional: No fever/chills Eyes: No visual changes. ENT: No sore throat. Needed for ear pain. Cardiovascular: Denies chest pain. Respiratory: Denies shortness of breath. Gastrointestinal:  No nausea, no vomiting.   Musculoskeletal: Negative for back pain.positive for neck pain. Skin: Negative for rash. Neurological: positive for headaches, no focal weakness or numbness. ____________________________________________   PHYSICAL EXAM:  VITAL SIGNS: ED Triage Vitals  Enc Vitals Group     BP 02/23/17 0745 (!) 155/94     Pulse Rate 02/23/17 0745 (!) 52     Resp --      Temp 02/23/17 0745 (!) 97.5 F (36.4 C)     Temp Source 02/23/17 0745 Oral     SpO2 02/23/17 0745 100 %     Weight 02/23/17 0747 180 lb (81.6 kg)     Height 02/23/17 0747 6' (1.829 m)     Head Circumference --      Peak Flow --      Pain Score 02/23/17 0744 7     Pain Loc --      Pain Edu? --      Excl. in GC? --    Constitutional: Alert and oriented. Well appearing and in no acute distress. Eyes: Conjunctivae are normal. PERRL. EOMI. Head: Atraumatic. Nose: No congestion/rhinnorhea. Mouth/Throat: Mucous membranes are moist.  Oropharynx non-erythematous. Neck: No stridor.  There is no point tenderness on palpation  of the cervical spine however there is tenderness on palpation of the left cervical muscles and down into the trapezius muscles. Patient is restricted on lateral movement and also upper. Patient is able to talk his chin down which causes a pulling sensation on the back of his neck. Hematological/Lymphatic/Immunilogical: No cervical lymphadenopathy. Cardiovascular: Normal rate, regular rhythm. Grossly normal heart sounds.  Good peripheral circulation. Respiratory: Normal respiratory effort.  No retractions. Lungs CTAB. Musculoskeletal: patient moves upper and lower extremities without any difficulty. Patient was ambulatory in the  department without any assistance. Normal gait was noted. Neurologic:  Normal speech and language. No gross focal neurologic deficits are appreciated. ranial nerves II through XII grossly intact. Patient is able to answer questions appropriately.   Skin:  Skin is warm, dry and intact. No rash noted. Psychiatric: Mood and affect are normal. Speech and behavior are normal.  ____________________________________________   LABS (all labs ordered are listed, but only abnormal results are displayed)  Labs Reviewed - No data to display  RADIOLOGY  Deferred. ____________________________________________   PROCEDURES  Procedure(s) performed: None  Procedures  Critical Care performed: No  ____________________________________________   INITIAL IMPRESSION / ASSESSMENT AND PLAN / ED COURSE Following examination of the left trapezius muscle. It is no tenderness on palpation.patient states that he is feeling much better.   patient was given oxycodone 1 tablet which relieved his headache and also improved his range of motion of his neck. Patient at time of his discharge denied any headache. He was given a limited amount of Norco to take for the remainder of the day. He is encouraged to use ice or heat to his neck as needed for discomfort. Patient [facility] if any continued problems. He is return immediately to the emergency room if any severe worsening of his symptoms. ____________________________________________   FINAL CLINICAL IMPRESSION(S) / ED DIAGNOSES  Final diagnoses:  Cervical muscle pain  Tension-type headache, not intractable, unspecified chronicity pattern      NEW MEDICATIONS STARTED DURING THIS VISIT:  Discharge Medication List as of 02/23/2017  9:31 AM    START taking these medications   Details  HYDROcodone-acetaminophen (NORCO/VICODIN) 5-325 MG tablet Take 1 tablet by mouth every 6 (six) hours as needed for moderate pain., Starting  Fri 02/23/2017, Print         Note:  This document was prepared using Dragon voice recognition software and may include unintentional dictation errors.    Tommi RumpsSummers, Rhonda L, PA-C 02/23/17 Merrily [id number]    Minna AntisPaduchowski, Kevin, MD 02/26/17 1550

## 2017-03-09 NOTE — Progress Notes (Signed)
Patient left without being seen.

## 2017-03-12 ENCOUNTER — Ambulatory Visit (INDEPENDENT_AMBULATORY_CARE_PROVIDER_SITE_OTHER): Payer: Medicare Other | Admitting: Urology

## 2017-03-12 ENCOUNTER — Encounter: Payer: Self-pay | Admitting: Urology

## 2017-03-12 VITALS — BP 132/79 | HR 59 | Ht 72.0 in | Wt 173.5 lb

## 2017-03-12 DIAGNOSIS — R972 Elevated prostate specific antigen [PSA]: Secondary | ICD-10-CM | POA: Insufficient documentation

## 2017-03-12 DIAGNOSIS — R351 Nocturia: Secondary | ICD-10-CM

## 2017-03-12 DIAGNOSIS — N138 Other obstructive and reflux uropathy: Secondary | ICD-10-CM

## 2017-03-12 DIAGNOSIS — N401 Enlarged prostate with lower urinary tract symptoms: Secondary | ICD-10-CM

## 2017-03-19 ENCOUNTER — Telehealth: Payer: Self-pay | Admitting: Urology

## 2017-04-16 NOTE — Telephone Encounter (Signed)
Error

## 2017-06-18 NOTE — Progress Notes (Signed)
06/19/2017 11:03 AM   Timothy Robbins 13-Feb-1934 [id number]  Referring provider: Jerl MinaHedrick, Rajan, MD [address] [facility] [place], [place] [id number]  Chief Complaint  Patient presents with  . Urinary Frequency    HPI: Patient is an 84 year old Caucasian male with BPH and nocturia who presents today for follow up for BPH with LU TS.    BPH WITH LUTS  (prostate and/or bladder) His IPSS score today is 9, which is moderate lower urinary tract symptomatology.  He is mixed with his quality life due to his urinary symptoms.  His PVR is 94 mL.  His previous I PSS score was 5/4.  His previous PVR was 0 mL.  His major complaint(s) today frequency, urgency and nocturia.  He has had these symptoms for the last year  He denies any dysuria, hematuria or suprapubic pain.   He also denies any recent fevers, chills, nausea or vomiting.  He did not find the tamsulosin helpful.    IPSS    Row Name 06/19/17 1000         International Prostate Symptom Score   How often have you had the sensation of not emptying your bladder?  Not at All     How often have you had to urinate less than every two hours?  Less than 1 in 5 times     How often have you found you stopped and started again several times when you urinated?  Less than 1 in 5 times     How often have you found it difficult to postpone urination?  About half the time     How often have you had a weak urinary stream?  Less than 1 in 5 times     How often have you had to strain to start urination?  Not at All     How many times did you typically get up at night to urinate?  3 Times     Total IPSS Score  9       Quality of Life due to urinary symptoms   If you were to spend the rest of your life with your urinary condition just the way it is now how would you feel about that?  Mixed        Score:  1-7 Mild 8-19 Moderate 20-35 Severe  Nocturia Nocturia has increased to 3 times nighty.  He is still not interested in a sleep  study.    PMH: Past Medical History:  Diagnosis Date  . Elevated PSA     Surgical History: Past Surgical History:  Procedure Laterality Date  . APPENDECTOMY  1955  . BACK SURGERY  1980  . HERNIA REPAIR Left 2008    Home Medications:  Allergies as of 06/19/2017   No Known Allergies     Medication List        Accurate as of 06/19/17 11:03 AM. Always use your most recent med list.          tamsulosin 0.4 MG Caps capsule Commonly known as:  FLOMAX Take 1 capsule (0.4 mg total) by mouth daily.       Allergies: No Known Allergies  Family History: Family History  Problem Relation Age of Onset  . Bladder Cancer Neg Hx   . Kidney cancer Neg Hx   . Prostate cancer Neg Hx   . Cancer Neg Hx     Social History:  reports that  has never smoked. he has never  used smokeless tobacco. He reports that he drinks alcohol. He reports that he does not use drugs.  ROS: UROLOGY Frequent Urination?: Yes Hard to postpone urination?: Yes Burning/pain with urination?: No Get up at night to urinate?: Yes Leakage of urine?: No Urine stream starts and stops?: No Trouble starting stream?: No Do you have to strain to urinate?: No Blood in urine?: No Urinary tract infection?: No Sexually transmitted disease?: No Injury to kidneys or bladder?: No Painful intercourse?: No Weak stream?: No Erection problems?: No Penile pain?: No  Gastrointestinal Nausea?: No Vomiting?: No Indigestion/heartburn?: No Diarrhea?: No Constipation?: No  Constitutional Fever: No Night sweats?: No Weight loss?: No Fatigue?: No  Skin Skin rash/lesions?: No Itching?: No  Eyes Blurred vision?: No Double vision?: No  Ears/Nose/Throat Sore throat?: No Sinus problems?: No  Hematologic/Lymphatic Swollen glands?: No Easy bruising?: No  Cardiovascular Leg swelling?: No Chest pain?: No  Respiratory Cough?: No Shortness of breath?: No  Endocrine Excessive thirst?:  No  Musculoskeletal Back pain?: No Joint pain?: No  Neurological Headaches?: No Dizziness?: No  Psychologic Depression?: No Anxiety?: No  Physical Exam: BP 128/81   Pulse (!) 51   Ht 6' (1.829 m)   Wt 176 lb (79.8 kg)   BMI 23.87 kg/m   Constitutional: Well nourished. Alert and oriented, No acute distress. HEENT: [address] AT, moist mucus membranes. Trachea midline, no masses. Cardiovascular: No clubbing, cyanosis, or edema. Respiratory: Normal respiratory effort, no increased work of breathing. GI: Abdomen is soft, non tender, non distended, no abdominal masses. Liver and spleen not palpable.  No hernias appreciated.  Stool sample for occult testing is not indicated.   GU: No CVA tenderness.  No bladder fullness or masses.  Patient with uncircumcised phallus.  Foreskin easily retracted Urethral meatus is patent.  No penile discharge. No penile lesions or rashes. Scrotum without lesions, cysts, rashes and/or edema.  Testicles are located scrotally bilaterally. No masses are appreciated in the testicles. Left and right epididymis are normal. Rectal: Patient with  normal sphincter tone. Anus and perineum without scarring or rashes. No rectal masses are appreciated. Prostate is approximately 50 grams, slightly firm in the right lobe, no nodules are appreciated. Seminal vesicles are normal. Skin: No rashes, bruises or suspicious lesions. Lymph: No cervical or inguinal adenopathy. Neurologic: Grossly intact, no focal deficits, moving all 4 extremities. Psychiatric: Normal mood and affect.  Laboratory Data: Serum creatinine 1.3 in 12/2015 PSA History  1.23 ng/mL in 10/2014  2.10 ng/mL in 10/2015  I have independently reviewed the labs.  Pertinent Imaging: Results for JABREEL, CHIMENTO (MRN [id number]) as of 06/19/2017 10:53  Ref. Range 06/19/2017 00:00  Scan Result Unknown 94     Assessment & Plan:    1. BPH with LUTS  - IPSS score is 9/3. It is worsening  - Continue  conservative management, avoiding bladder irritants and timed voiding's  - most bothersome symptoms is/are frequency, urgency and nocturia  - discontinue tamsulosin 0.4 mg daily - start Myrbetriq 25 mg daily  - RTC in three weeks for I PSS and PVR   2. Nocturia  - I explained to the patient that nocturia is often multi-factorial and difficult to treat.  Sleeping disorders, heart conditions, peripheral vascular disease, diabetes, an enlarged prostate for men, an urethral stricture causing bladder outlet obstruction and/or certain medications can contribute to nocturia.  - The patient may benefit from a discussion with his or her primary care physician to see if he or she has risk factors  for sleep apnea or other sleep disturbances and obtaining a sleep study - patient does not believe he has sleep apnea     Return in about 3 weeks (around 07/10/2017) for IPSS and PVR.  These notes generated with voice recognition software. I apologize for typographical errors.  Michiel Cowboy, PA-C  [facility] [address], [address] [place], [place] [address] [phone]

## 2017-06-19 ENCOUNTER — Ambulatory Visit: Payer: Medicare Other | Admitting: Urology

## 2017-06-19 ENCOUNTER — Encounter: Payer: Self-pay | Admitting: Urology

## 2017-06-19 VITALS — BP 128/81 | HR 51 | Ht 72.0 in | Wt 176.0 lb

## 2017-06-19 DIAGNOSIS — N138 Other obstructive and reflux uropathy: Secondary | ICD-10-CM

## 2017-06-19 DIAGNOSIS — N401 Enlarged prostate with lower urinary tract symptoms: Secondary | ICD-10-CM

## 2017-06-19 DIAGNOSIS — R351 Nocturia: Secondary | ICD-10-CM | POA: Diagnosis not present

## 2017-06-19 LAB — BLADDER SCAN AMB NON-IMAGING: Scan Result: 94

## 2017-06-29 ENCOUNTER — Telehealth: Payer: Self-pay | Admitting: Urology

## 2017-06-29 NOTE — Telephone Encounter (Signed)
Pt called office with concerns about medication that Carollee HerterShannon started him on. Seems his issue has gotten worse the past few days and is bothersome.  Pt is asking that someone please call him about this. Please Advise, thanks.

## 2017-06-29 NOTE — Telephone Encounter (Signed)
Left pt mess to call/SW 

## 2017-07-10 ENCOUNTER — Encounter: Payer: Self-pay | Admitting: Urology

## 2017-07-10 ENCOUNTER — Ambulatory Visit: Payer: Medicare Other | Admitting: Urology

## 2017-07-10 VITALS — BP 133/81 | HR 50 | Ht 73.0 in | Wt 175.0 lb

## 2017-07-10 DIAGNOSIS — N138 Other obstructive and reflux uropathy: Secondary | ICD-10-CM | POA: Diagnosis not present

## 2017-07-10 DIAGNOSIS — R350 Frequency of micturition: Secondary | ICD-10-CM | POA: Diagnosis not present

## 2017-07-10 DIAGNOSIS — N401 Enlarged prostate with lower urinary tract symptoms: Secondary | ICD-10-CM | POA: Diagnosis not present

## 2017-07-10 DIAGNOSIS — R351 Nocturia: Secondary | ICD-10-CM

## 2017-07-10 LAB — BLADDER SCAN AMB NON-IMAGING: Scan Result: 25

## 2017-07-10 NOTE — Progress Notes (Signed)
07/10/2017 2:58 PM   Elenor Legato Jul 23, 1933 [id number]  Referring provider: Jerl Mina, MD [address] [facility] [place], [place] [address]  Chief Complaint  Patient presents with  . Benign Prostatic Hypertrophy  . Nocturia  . Follow-up    HPI: Patient is an 84 year old Caucasian male with BPH and nocturia who presents today for follow up for BPH with LU TS.    BPH WITH LUTS  (prostate and/or bladder) His IPSS score today is 7, which is mild lower urinary tract symptomatology.  He is mixed with his quality life due to his urinary symptoms.  His PVR is 25 mL.  His BP is 133/81.  His previous I PSS score was 9/3.  His previous PVR was 94 mL.  At his visit on 06/19/2017, his tamsulosin was discontinued and he was started on Myrbetriq 25 mg daily.  His major complaint(s) today frequency, urgency and nocturia.   He has noticed less frequency on the Myrbetriq 25 mg daily.  He denies any dysuria, hematuria or suprapubic pain.   He also denies any recent fevers, chills, nausea or vomiting.    IPSS    Row Name 06/19/17 1000 07/10/17 1400       International Prostate Symptom Score   How often have you had the sensation of not emptying your bladder?  Not at All  Less than 1 in 5    How often have you had to urinate less than every two hours?  Less than 1 in 5 times  Less than 1 in 5 times    How often have you found you stopped and started again several times when you urinated?  Less than 1 in 5 times  Less than 1 in 5 times    How often have you found it difficult to postpone urination?  About half the time  Less than half the time    How often have you had a weak urinary stream?  Less than 1 in 5 times  Not at All    How often have you had to strain to start urination?  Not at All  Not at All    How many times did you typically get up at night to urinate?  3 Times  2 Times    Total IPSS Score  9  7      Quality of Life due to urinary symptoms   If you were to spend  the rest of your life with your urinary condition just the way it is now how would you feel about that?  Mixed  Mixed       Score:  1-7 Mild 8-19 Moderate 20-35 Severe  Nocturia Nocturia has increased to 3 times nighty.  He is still not interested in a sleep study.    PMH: Past Medical History:  Diagnosis Date  . Elevated PSA     Surgical History: Past Surgical History:  Procedure Laterality Date  . APPENDECTOMY  1955  . BACK SURGERY  1980  . HERNIA REPAIR Left 2008    Home Medications:  Allergies as of 07/10/2017   No Known Allergies     Medication List        Accurate as of 07/10/17  2:58 PM. Always use your most recent med list.          tamsulosin 0.4 MG Caps capsule Commonly known as:  FLOMAX Take 1 capsule (0.4 mg total) by mouth daily.  Allergies: No Known Allergies  Family History: Family History  Problem Relation Age of Onset  . Bladder Cancer Neg Hx   . Kidney cancer Neg Hx   . Prostate cancer Neg Hx   . Cancer Neg Hx     Social History:  reports that  has never smoked. he has never used smokeless tobacco. He reports that he drinks alcohol. He reports that he does not use drugs.  ROS: UROLOGY Frequent Urination?: Yes Hard to postpone urination?: Yes Burning/pain with urination?: No Get up at night to urinate?: Yes Leakage of urine?: No Urine stream starts and stops?: No Trouble starting stream?: No Do you have to strain to urinate?: No Blood in urine?: No Urinary tract infection?: No Sexually transmitted disease?: No Injury to kidneys or bladder?: No Painful intercourse?: No Weak stream?: No Erection problems?: No Penile pain?: No  Gastrointestinal Nausea?: No Vomiting?: No Indigestion/heartburn?: No Diarrhea?: No Constipation?: No  Constitutional Fever: No Night sweats?: No Weight loss?: No Fatigue?: No  Skin Skin rash/lesions?: No Itching?: No  Eyes Blurred vision?: No Double vision?:  No  Ears/Nose/Throat Sore throat?: No Sinus problems?: No  Hematologic/Lymphatic Swollen glands?: No Easy bruising?: No  Cardiovascular Leg swelling?: No Chest pain?: No  Respiratory Cough?: No Shortness of breath?: No  Endocrine Excessive thirst?: No  Musculoskeletal Back pain?: No Joint pain?: No  Neurological Headaches?: No Dizziness?: No  Psychologic Depression?: No Anxiety?: No  Physical Exam: BP 133/81   Pulse (!) 50   Ht 6\' 1"  (1.854 m)   Wt 175 lb (79.4 kg)   BMI 23.09 kg/m   Constitutional: Well nourished. Alert and oriented, No acute distress. HEENT: [place] AT, moist mucus membranes. Trachea midline, no masses. Cardiovascular: No clubbing, cyanosis, or edema. Respiratory: Normal respiratory effort, no increased work of breathing. Skin: No rashes, bruises or suspicious lesions. Lymph: No cervical or inguinal adenopathy. Neurologic: Grossly intact, no focal deficits, moving all 4 extremities. Psychiatric: Normal mood and affect.  Laboratory Data: Serum creatinine 1.3 in 12/2015 PSA History  1.23 ng/mL in 10/2014  2.10 ng/mL in 10/2015  I have independently reviewed the labs.  Pertinent Imaging: Results for Elenor LegatoMCCAULEY, Gardiner D (MRN [id number]) as of 07/18/2017 14:36  Ref. Range 07/10/2017 14:47  Scan Result Unknown 25    Assessment & Plan:    1. BPH with LUTS  - IPSS score is 7/3, it is improving  - Continue conservative management, avoiding bladder irritants and timed voiding's  - most bothersome symptoms is/are frequency, urgency and nocturia  - increase the Myrbetriq to 50 mg daily   - RTC in three weeks for I PSS and PVR   2. Nocturia  - I explained to the patient that nocturia is often multi-factorial and difficult to treat.  Sleeping disorders, heart conditions, peripheral vascular disease, diabetes, an enlarged prostate for men, an urethral stricture causing bladder outlet obstruction and/or certain medications can contribute to  nocturia.  - The patient may benefit from a discussion with his or her primary care physician to see if he or she has risk factors for sleep apnea or other sleep disturbances and obtaining a sleep study - patient still does not believe he has sleep apnea    Return in about 3 weeks (around 07/31/2017) for IPSS and PVR.  These notes generated with voice recognition software. I apologize for typographical errors.  Michiel CowboySHANNON Mariavictoria Nottingham, PA-C  [facility] [address], [address] [place], [place] [address] [phone]

## 2017-07-10 NOTE — Patient Instructions (Addendum)
DASH Eating Plan DASH stands for "Dietary Approaches to Stop Hypertension." The DASH eating plan is a healthy eating plan that has been shown to reduce high blood pressure (hypertension). It may also reduce your risk for type 2 diabetes, heart disease, and stroke. The DASH eating plan may also help with weight loss. What are tips for following this plan? General guidelines  Avoid eating more than 2,300 mg (milligrams) of salt (sodium) a day. If you have hypertension, you may need to reduce your sodium intake to 1,500 mg a day.  Limit alcohol intake to no more than 1 drink a day for nonpregnant women and 2 drinks a day for men. One drink equals 12 oz of beer, 5 oz of wine, or 1 oz of hard liquor.  Work with your health care provider to maintain a healthy body weight or to lose weight. Ask what an ideal weight is for you.  Get at least 30 minutes of exercise that causes your heart to beat faster (aerobic exercise) most days of the week. Activities may include walking, swimming, or biking.  Work with your health care provider or diet and nutrition specialist (dietitian) to adjust your eating plan to your individual calorie needs. Reading food labels  Check food labels for the amount of sodium per serving. Choose foods with less than 5 percent of the Daily Value of sodium. Generally, foods with less than 300 mg of sodium per serving fit into this eating plan.  To find whole grains, look for the word "whole" as the first word in the ingredient list. Shopping  Buy products labeled as "low-sodium" or "no salt added."  Buy fresh foods. Avoid canned foods and premade or frozen meals. Cooking  Avoid adding salt when cooking. Use salt-free seasonings or herbs instead of table salt or sea salt. Check with your health care provider or pharmacist before using salt substitutes.  Do not fry foods. Cook foods using healthy methods such as baking, boiling, grilling, and broiling instead.  Cook with  heart-healthy oils, such as olive, canola, soybean, or sunflower oil. Meal planning   Eat a balanced diet that includes: ? 5 or more servings of fruits and vegetables each day. At each meal, try to fill half of your plate with fruits and vegetables. ? Up to 6-8 servings of whole grains each day. ? Less than 6 oz of lean meat, poultry, or fish each day. A 3-oz serving of meat is about the same size as a deck of cards. One egg equals 1 oz. ? 2 servings of low-fat dairy each day. ? A serving of nuts, seeds, or beans 5 times each week. ? Heart-healthy fats. Healthy fats called Omega-3 fatty acids are found in foods such as flaxseeds and coldwater fish, like sardines, salmon, and mackerel.  Limit how much you eat of the following: ? Canned or prepackaged foods. ? Food that is high in trans fat, such as fried foods. ? Food that is high in saturated fat, such as fatty meat. ? Sweets, desserts, sugary drinks, and other foods with added sugar. ? Full-fat dairy products.  Do not salt foods before eating.  Try to eat at least 2 vegetarian meals each week.  Eat more home-cooked food and less restaurant, buffet, and fast food.  When eating at a restaurant, ask that your food be prepared with less salt or no salt, if possible. What foods are recommended? The items listed may not be a complete list. Talk with your dietitian about what   dietary choices are best for you. Grains Whole-grain or whole-wheat bread. Whole-grain or whole-wheat pasta. Brown rice. Oatmeal. Quinoa. Bulgur. Whole-grain and low-sodium cereals. Pita bread. Low-fat, low-sodium crackers. Whole-wheat flour tortillas. Vegetables Fresh or frozen vegetables (raw, steamed, roasted, or grilled). Low-sodium or reduced-sodium tomato and vegetable juice. Low-sodium or reduced-sodium tomato sauce and tomato paste. Low-sodium or reduced-sodium canned vegetables. Fruits All fresh, dried, or frozen fruit. Canned fruit in natural juice (without  added sugar). Meat and other protein foods Skinless chicken or turkey. Ground chicken or turkey. Pork with fat trimmed off. Fish and seafood. Egg whites. Dried beans, peas, or lentils. Unsalted nuts, nut butters, and seeds. Unsalted canned beans. Lean cuts of beef with fat trimmed off. Low-sodium, lean deli meat. Dairy Low-fat (1%) or fat-free (skim) milk. Fat-free, low-fat, or reduced-fat cheeses. Nonfat, low-sodium ricotta or cottage cheese. Low-fat or nonfat yogurt. Low-fat, low-sodium cheese. Fats and oils Soft margarine without trans fats. Vegetable oil. Low-fat, reduced-fat, or light mayonnaise and salad dressings (reduced-sodium). Canola, safflower, olive, soybean, and sunflower oils. Avocado. Seasoning and other foods Herbs. Spices. Seasoning mixes without salt. Unsalted popcorn and pretzels. Fat-free sweets. What foods are not recommended? The items listed may not be a complete list. Talk with your dietitian about what dietary choices are best for you. Grains Baked goods made with fat, such as croissants, muffins, or some breads. Dry pasta or rice meal packs. Vegetables Creamed or fried vegetables. Vegetables in a cheese sauce. Regular canned vegetables (not low-sodium or reduced-sodium). Regular canned tomato sauce and paste (not low-sodium or reduced-sodium). Regular tomato and vegetable juice (not low-sodium or reduced-sodium). Pickles. Olives. Fruits Canned fruit in a light or heavy syrup. Fried fruit. Fruit in cream or butter sauce. Meat and other protein foods Fatty cuts of meat. Ribs. Fried meat. Bacon. Sausage. Bologna and other processed lunch meats. Salami. Fatback. Hotdogs. Bratwurst. Salted nuts and seeds. Canned beans with added salt. Canned or smoked fish. Whole eggs or egg yolks. Chicken or turkey with skin. Dairy Whole or 2% milk, cream, and half-and-half. Whole or full-fat cream cheese. Whole-fat or sweetened yogurt. Full-fat cheese. Nondairy creamers. Whipped toppings.  Processed cheese and cheese spreads. Fats and oils Butter. Stick margarine. Lard. Shortening. Ghee. Bacon fat. Tropical oils, such as coconut, palm kernel, or palm oil. Seasoning and other foods Salted popcorn and pretzels. Onion salt, garlic salt, seasoned salt, table salt, and sea salt. Worcestershire sauce. Tartar sauce. Barbecue sauce. Teriyaki sauce. Soy sauce, including reduced-sodium. Steak sauce. Canned and packaged gravies. Fish sauce. Oyster sauce. Cocktail sauce. Horseradish that you find on the shelf. Ketchup. Mustard. Meat flavorings and tenderizers. Bouillon cubes. Hot sauce and Tabasco sauce. Premade or packaged marinades. Premade or packaged taco seasonings. Relishes. Regular salad dressings. Where to find more information:  National Heart, Lung, and Blood Institute: www.nhlbi.nih.gov  American Heart Association: www.heart.org Summary  The DASH eating plan is a healthy eating plan that has been shown to reduce high blood pressure (hypertension). It may also reduce your risk for type 2 diabetes, heart disease, and stroke.  With the DASH eating plan, you should limit salt (sodium) intake to 2,300 mg a day. If you have hypertension, you may need to reduce your sodium intake to 1,500 mg a day.  When on the DASH eating plan, aim to eat more fresh fruits and vegetables, whole grains, lean proteins, low-fat dairy, and heart-healthy fats.  Work with your health care provider or diet and nutrition specialist (dietitian) to adjust your eating plan to your individual   calorie needs. This information is not intended to replace advice given to you by your health care provider. Make sure you discuss any questions you have with your health care provider. Document Released: 04/13/2011 Document Revised: 04/17/2016 Document Reviewed: 04/17/2016 Elsevier Interactive Patient Education  2018 Elsevier Inc.  

## 2017-08-02 ENCOUNTER — Ambulatory Visit: Payer: Medicare Other | Admitting: Urology

## 2017-12-14 ENCOUNTER — Encounter: Payer: Self-pay | Admitting: Urology

## 2017-12-14 ENCOUNTER — Ambulatory Visit (INDEPENDENT_AMBULATORY_CARE_PROVIDER_SITE_OTHER): Payer: Medicare Other | Admitting: Urology

## 2017-12-14 VITALS — BP 130/80 | HR 52 | Ht 72.0 in | Wt 176.0 lb

## 2017-12-14 DIAGNOSIS — R351 Nocturia: Secondary | ICD-10-CM | POA: Diagnosis not present

## 2017-12-16 ENCOUNTER — Encounter: Payer: Self-pay | Admitting: Urology

## 2017-12-16 MED ORDER — DESMOPRESSIN ACETATE 55.3 MCG SL SUBL: 1.0000 | SUBLINGUAL_TABLET | Freq: Every day | SUBLINGUAL | 0 refills | Status: AC

## 2017-12-16 NOTE — Progress Notes (Signed)
12/14/2017 8:56 AM   Elenor Legato 1934-03-02 [id number]  Referring provider: Jerl Mina, MD [address] [facility] [place], [place] [address]  Chief Complaint  Patient presents with  . other    HPI: 84 year old male with a long history of lower urinary tract symptoms.  He has been followed regularly by Michiel Cowboy last saw her in March 2015.  He presents today to discuss to UroLift.  He has a mild to moderate lower urinary tract symptoms with his most bothersome lower urinary tract symptoms being nocturia x2-3 on average.  He does have occasional daytime frequency and urgency.  His IPSS in March 2019 was 7/35 and he states his voiding pattern is stable.  Prior treatments include tamsulosin and Myrbetriq.  He had mild improvement on Myrbetriq however tamsulosin was not effective.  He voids with a good stream and denies urinary hesitancy or intermittency.   PMH: Past Medical History:  Diagnosis Date  . Elevated PSA     Surgical History: Past Surgical History:  Procedure Laterality Date  . APPENDECTOMY  1955  . BACK SURGERY  1980  . HERNIA REPAIR Left 2008    Home Medications:  Allergies as of 12/14/2017   No Known Allergies     Medication List    as of 12/14/2017 11:59 PM   You have not been prescribed any medications.     Allergies: No Known Allergies  Family History: Family History  Problem Relation Age of Onset  . Bladder Cancer Neg Hx   . Kidney cancer Neg Hx   . Prostate cancer Neg Hx   . Cancer Neg Hx     Social History:  reports that he has never smoked. He has never used smokeless tobacco. He reports that he drinks alcohol. He reports that he does not use drugs.  ROS: UROLOGY Frequent Urination?: Yes Hard to postpone urination?: Yes Burning/pain with urination?: No Get up at night to urinate?: Yes Leakage of urine?: No Urine stream starts and stops?: No Trouble starting stream?: No Do you have to strain to urinate?:  No Blood in urine?: No Urinary tract infection?: No Sexually transmitted disease?: No Injury to kidneys or bladder?: No Painful intercourse?: No Weak stream?: No Erection problems?: No Penile pain?: No  Gastrointestinal Nausea?: No Vomiting?: No Indigestion/heartburn?: No Diarrhea?: No Constipation?: No  Constitutional Fever: No Night sweats?: No Weight loss?: No Fatigue?: No  Skin Skin rash/lesions?: Yes Itching?: No  Eyes Blurred vision?: No Double vision?: No  Ears/Nose/Throat Sore throat?: No Sinus problems?: No  Hematologic/Lymphatic Swollen glands?: No Easy bruising?: No  Cardiovascular Leg swelling?: No Chest pain?: No  Respiratory Cough?: No Shortness of breath?: No  Endocrine Excessive thirst?: No  Musculoskeletal Back pain?: No Joint pain?: No  Neurological Headaches?: No Dizziness?: No  Psychologic Depression?: No Anxiety?: No  Physical Exam: BP 130/80   Pulse (!) 52   Ht 6' (1.829 m)   Wt 176 lb (79.8 kg)   BMI 23.87 kg/m   Constitutional:  Alert and oriented, No acute distress. HEENT: [place] AT, moist mucus membranes.  Trachea midline, no masses. Cardiovascular: No clubbing, cyanosis, or edema. Respiratory: Normal respiratory effort, no increased work of breathing. GI: Abdomen is soft, nontender, nondistended, no abdominal masses GU: No CVA tenderness Lymph: No cervical or inguinal lymphadenopathy. Skin: No rashes, bruises or suspicious lesions. Neurologic: Grossly intact, no focal deficits, moving all 4 extremities. Psychiatric: Normal mood and affect.   Assessment & Plan:   84 year old male  with storage related voiding symptoms and nocturia.  His nocturia is his most bothersome symptom.  He was informed that nocturia is a symptom wheeze resistant to multiple therapies due to its variable etiology.  A cystoscopy could be performed to evaluate his outlet however based on his history I do not feel UroLift is an ideal option for  him.  I did discuss recent approval of Nocdurna and he was interested in a trial.  He was given samples of 55.7 mcg to take sublingually 1 hour prior to bedtime.  We discussed the possible side effect of hyponatremia and he will return in 1 week for a serum sodium level.  At that time he will be given additional samples and will follow-up in 1 month for symptom reassessment and repeat serum sodium level.    Riki AltesScott C Sheina Mcleish, MD  [facility] [address], [address] [place], [place] [address] [phone]

## 2017-12-19 ENCOUNTER — Telehealth: Payer: Self-pay | Admitting: Urology

## 2017-12-19 NOTE — Telephone Encounter (Signed)
Patient started taking Nocdurna [facility]  He cannot tolerate the medication.  He states that his nighttime urinary frequency has increased dramatically.  He reports that he is getting up every hour to urinate.  He states that he will stop taking the medication today and is wanting to see if there is an alternate medication that he can try.  He can be reached at [phone].

## 2017-12-19 NOTE — Telephone Encounter (Signed)
This is the most effective medication for getting up at night.  He has been tried on several other medications and there is no real alternative.

## 2017-12-21 ENCOUNTER — Other Ambulatory Visit: Payer: Medicare Other

## 2017-12-21 ENCOUNTER — Other Ambulatory Visit: Payer: Self-pay

## 2017-12-21 DIAGNOSIS — N401 Enlarged prostate with lower urinary tract symptoms: Principal | ICD-10-CM

## 2017-12-21 DIAGNOSIS — N138 Other obstructive and reflux uropathy: Secondary | ICD-10-CM

## 2017-12-21 NOTE — Telephone Encounter (Signed)
Patient notified

## 2017-12-22 LAB — SODIUM: SODIUM: 140 mmol/L (ref 134–144)

## 2019-04-29 ENCOUNTER — Ambulatory Visit: Payer: Medicare Other | Admitting: Urology

## 2019-11-20 ENCOUNTER — Emergency Department
Admission: EM | Admit: 2019-11-20 | Discharge: 2019-11-20 | Disposition: A | Discharge status: Home / Self Care | Payer: Medicare PPO | Attending: Emergency Medicine | Admitting: Emergency Medicine

## 2019-11-20 ENCOUNTER — Other Ambulatory Visit: Payer: Self-pay

## 2019-11-20 ENCOUNTER — Ambulatory Visit
Admission: EM | Admit: 2019-11-20 | Discharge: 2019-11-20 | Disposition: A | Discharge status: Home / Self Care | Payer: Medicare PPO | Attending: Family Medicine | Admitting: Family Medicine

## 2019-11-20 ENCOUNTER — Emergency Department: Payer: Medicare PPO

## 2019-11-20 ENCOUNTER — Encounter: Payer: Self-pay | Admitting: Emergency Medicine

## 2019-11-20 DIAGNOSIS — R200 Anesthesia of skin: Secondary | ICD-10-CM | POA: Insufficient documentation

## 2019-11-20 DIAGNOSIS — Z5321 Procedure and treatment not carried out due to patient leaving prior to being seen by health care provider: Secondary | ICD-10-CM | POA: Diagnosis not present

## 2019-11-20 DIAGNOSIS — R6884 Jaw pain: Secondary | ICD-10-CM | POA: Diagnosis not present

## 2019-11-20 DIAGNOSIS — R531 Weakness: Secondary | ICD-10-CM | POA: Diagnosis not present

## 2019-11-20 LAB — COMPREHENSIVE METABOLIC PANEL
ALT: 13 U/L (ref 0–44)
AST: 25 U/L (ref 15–41)
Albumin: 4.2 g/dL (ref 3.5–5.0)
Alkaline Phosphatase: 73 U/L (ref 38–126)
Anion gap: 8 (ref 5–15)
BUN: 25 mg/dL — ABNORMAL HIGH (ref 8–23)
CO2: 23 mmol/L (ref 22–32)
Calcium: 9 mg/dL (ref 8.9–10.3)
Chloride: 106 mmol/L (ref 98–111)
Creatinine, Ser: 1.16 mg/dL (ref 0.61–1.24)
GFR calc Af Amer: >60 mL/min (ref >60)
GFR calc non Af Amer: 58 mL/min — ABNORMAL LOW (ref >60)
Glucose, Bld: 93 mg/dL (ref 70–99)
Potassium: 4.1 mmol/L (ref 3.5–5.1)
Sodium: 137 mmol/L (ref 135–145)
Total Bilirubin: 0.7 mg/dL (ref 0.3–1.2)
Total Protein: 7.4 g/dL (ref 6.5–8.1)

## 2019-11-20 LAB — DIFFERENTIAL
Abs Immature Granulocytes: 0.02 10*3/uL (ref 0.00–0.07)
Basophils Absolute: 0 10*3/uL (ref 0.0–0.1)
Basophils Relative: 1 %
Eosinophils Absolute: 0.2 10*3/uL (ref 0.0–0.5)
Eosinophils Relative: 3 %
Immature Granulocytes: 0 %
Lymphocytes Relative: 22 %
Lymphs Abs: 1.8 10*3/uL (ref 0.7–4.0)
Monocytes Absolute: 0.8 10*3/uL (ref 0.1–1.0)
Monocytes Relative: 9 %
Neutro Abs: 5.6 10*3/uL (ref 1.7–7.7)
Neutrophils Relative %: 65 %

## 2019-11-20 LAB — CBC
HCT: 37.8 % — ABNORMAL LOW (ref 39.0–52.0)
Hemoglobin: 13.8 g/dL (ref 13.0–17.0)
MCH: 32.5 pg (ref 26.0–34.0)
MCHC: 36.5 g/dL — ABNORMAL HIGH (ref 30.0–36.0)
MCV: 89.2 fL (ref 80.0–100.0)
Platelets: 149 10*3/uL — ABNORMAL LOW (ref 150–400)
RBC: 4.24 MIL/uL (ref 4.22–5.81)
RDW: 12.6 % (ref 11.5–15.5)
WBC: 8.5 10*3/uL (ref 4.0–10.5)
nRBC: 0 % (ref 0.0–0.2)

## 2019-11-20 IMAGING — CT CT HEAD W/O CM
3 series · 16 of 47 positions shown, 19 images · non-contrast
Comparison: None.

CLINICAL DATA: Left facial numbness since [DATE].

EXAM:
CT HEAD WITHOUT CONTRAST
TECHNIQUE: Contiguous axial images were obtained from the base of the skull
through the vertex without intravenous contrast.

[Series 2: head wo · axial · 0.47mm/px · z∈[-144,-9]mm · 10 of 33 slices shown, 13 images]
[im 3/33  brain]
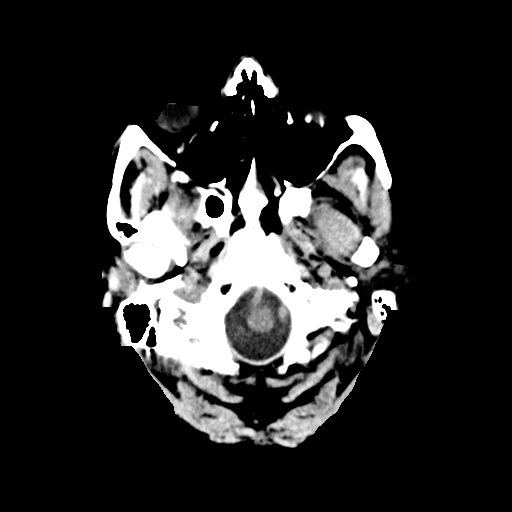
[im 3/33  bone]
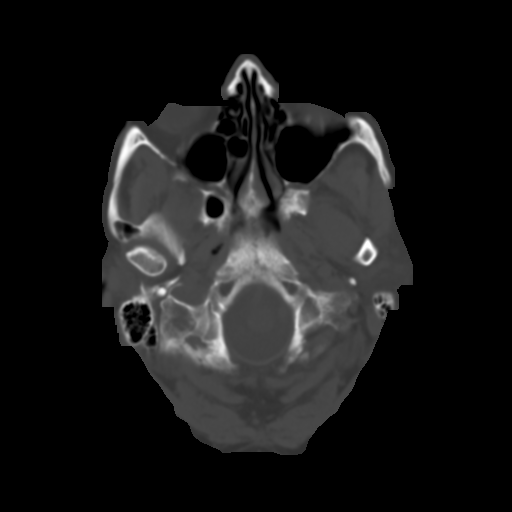
[im 6/33  brain]
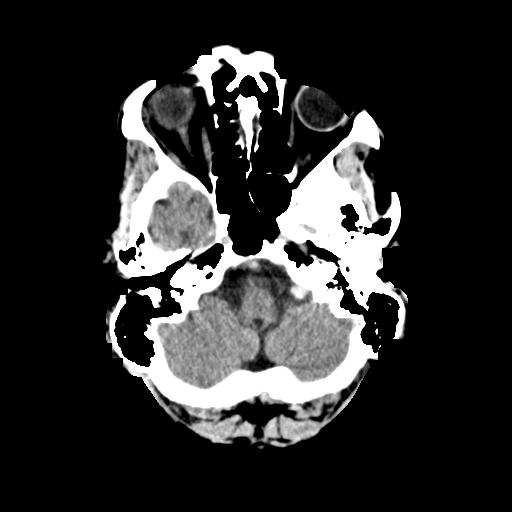
[im 9/33  brain]
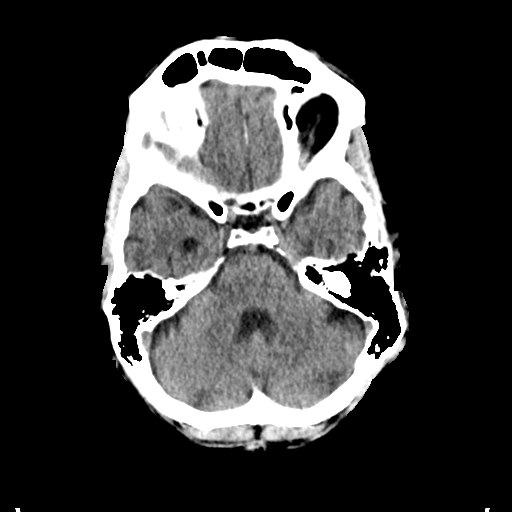
[im 12/33  brain]
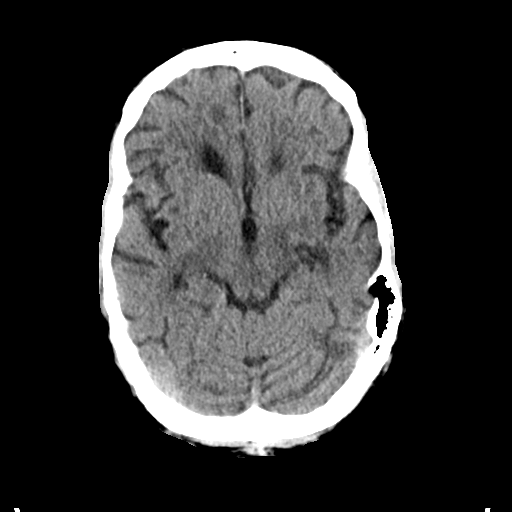
[im 15/33  brain]
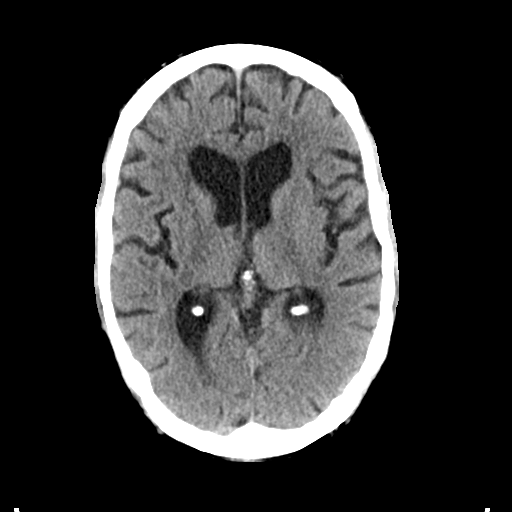
[im 15/33  bone]
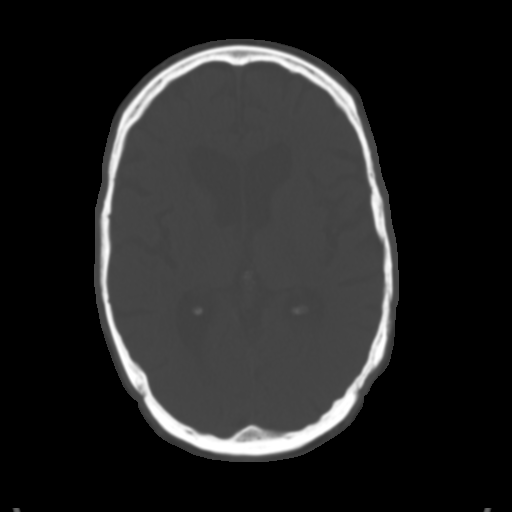
[im 18/33  brain]
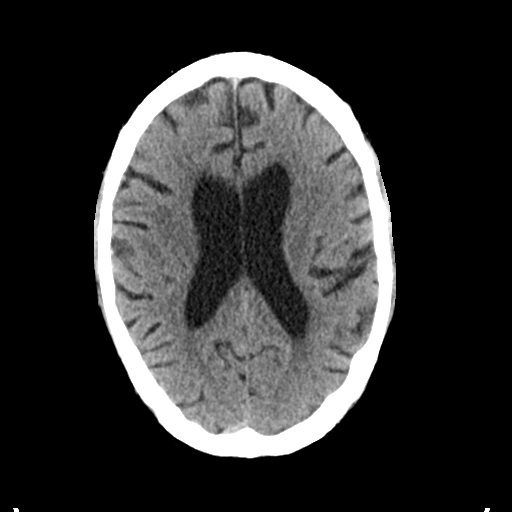
[im 21/33  brain]
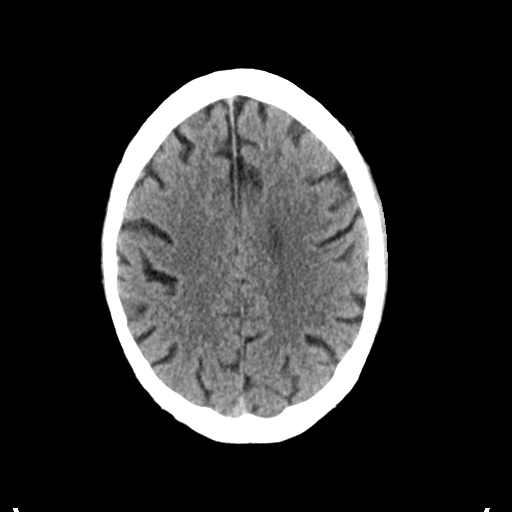
[im 25/33  brain]
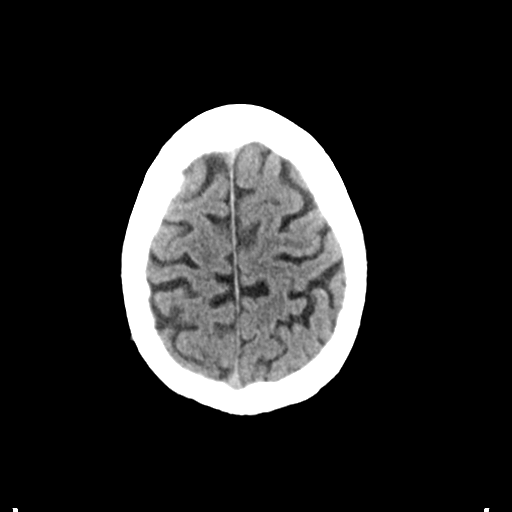
[im 27/33  brain]
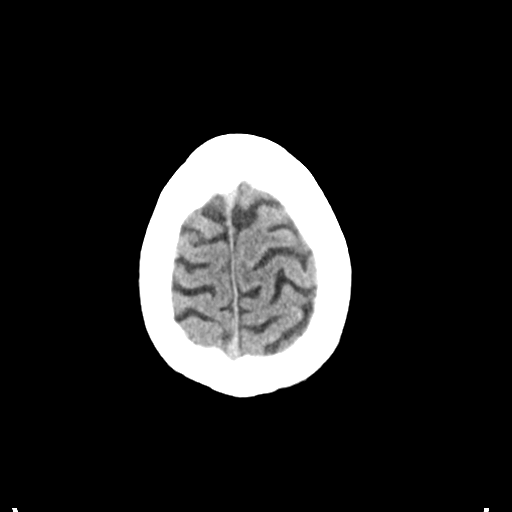
[im 27/33  bone]
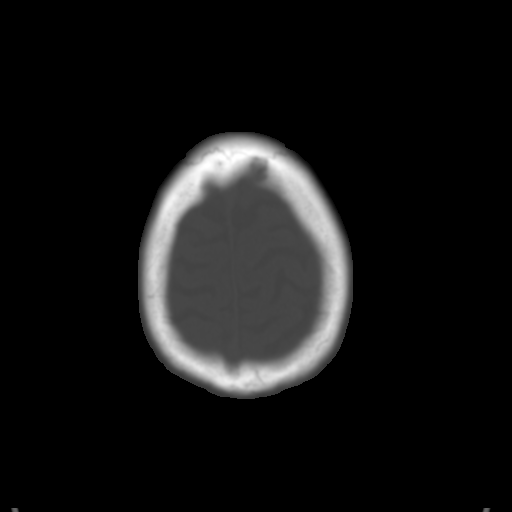
[im 30/33  brain]
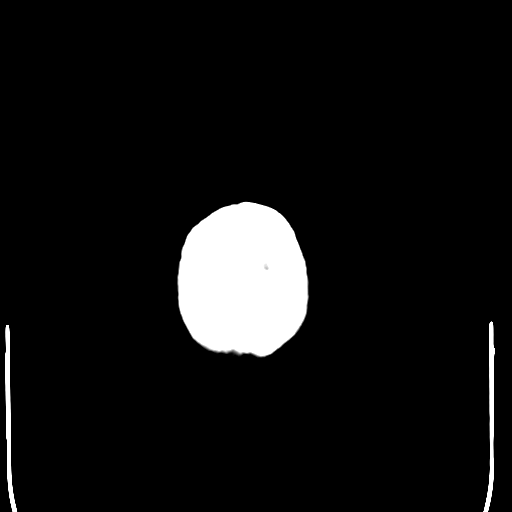

[Series 4: coronal soft tissue · coronal · 0.31mm/px · 3 of 68 slices shown]
[im 23/68  brain]
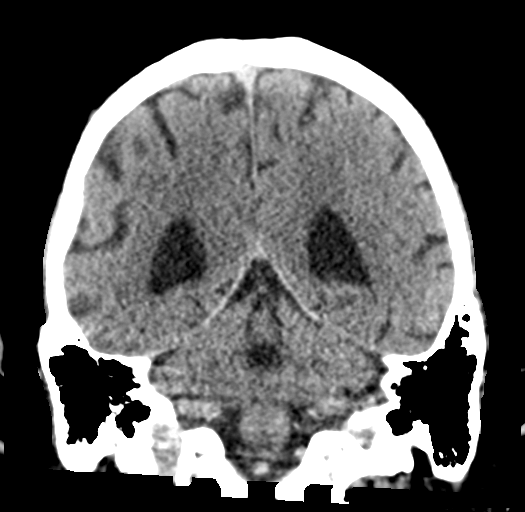
[im 30/68  brain]
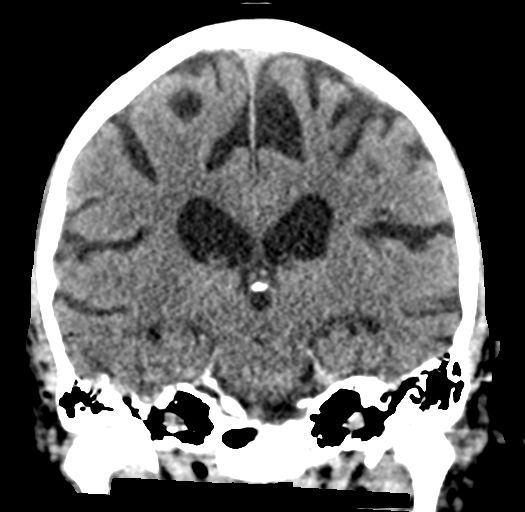
[im 38/68  brain]
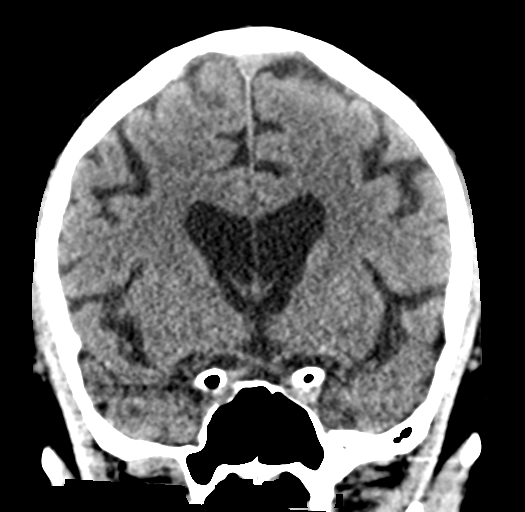

[Series 5: sagittal soft tissue · sagittal · 0.31mm/px · 3 of 56 slices shown]
[im 19/56  brain]
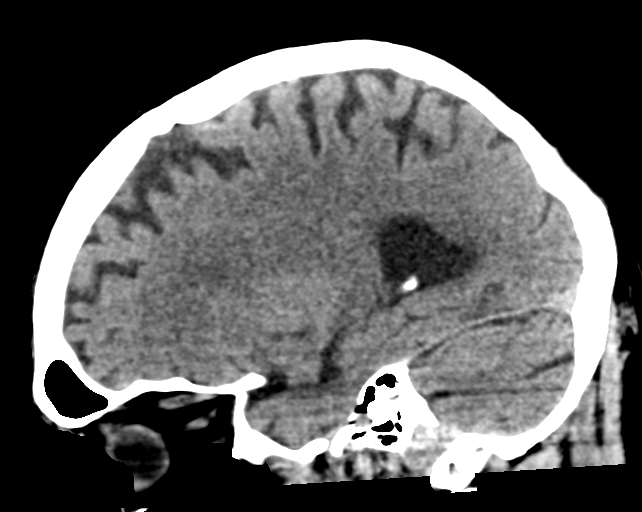
[im 28/56  brain]
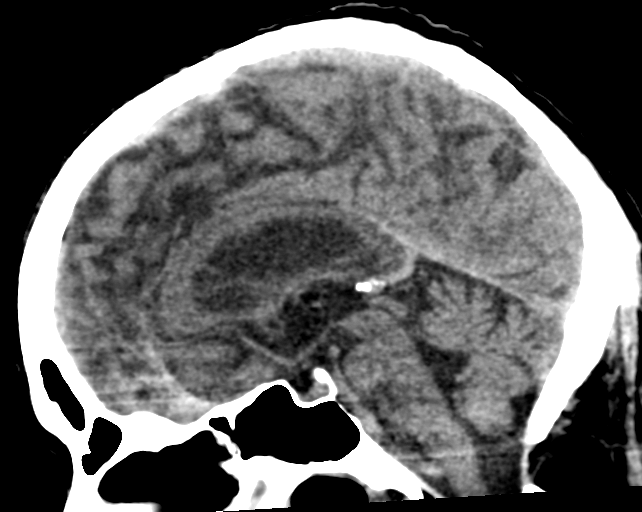
[im 37/56  brain]
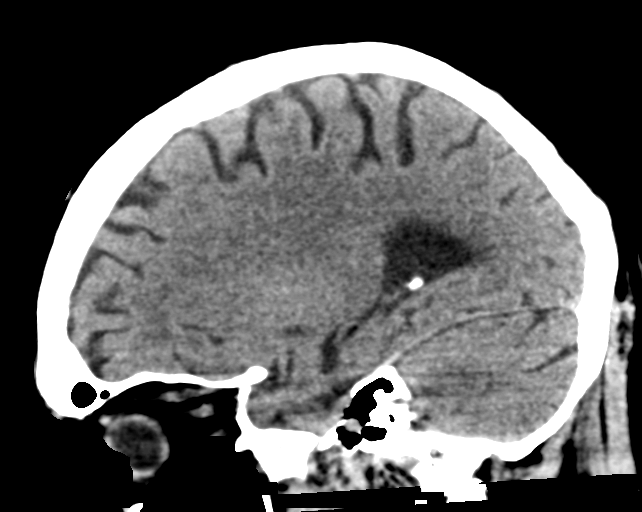

[16 of 47 positions shown; findings below may reference images not displayed]

FINDINGS: Brain: No evidence of acute infarction, hemorrhage, hydrocephalus,
extra-axial collection or mass lesion/mass effect.

Vascular: No hyperdense vessel or unexpected calcification.

Skull: Intact.  No focal lesion.

Sinuses/Orbits: Negative.

Other: None.
IMPRESSION: Negative head CT.

## 2019-11-20 MED ORDER — NAPROXEN 375 MG PO TABS: 375.0000 mg | ORAL_TABLET | Freq: Two times a day (BID) | ORAL | 0 refills | Status: DC

## 2019-11-20 NOTE — Discharge Instructions (Addendum)
You are experiencing an inflammatory response to your left jaw that is consistent with TMJ  I have prescribed naproxen that you may take twice a day to help relieve the pain  If these are not effective in relieving your pain, follow up with this office or with primary care as needed  Follow up with the ER for sudden loss of sensation, numbness, tingling, trouble swallowing, trouble breathing, other concerning symptoms.

## 2019-11-20 NOTE — ED Notes (Signed)
Pt reported he was leaving. Pt encouraged to stay. Pt reports it not waiting and is leaving.

## 2019-11-20 NOTE — ED Triage Notes (Signed)
Pt from home with left sided facial numbness that started Saturday and generalized body aches. Pt states the numbness "reduced the flexibility in my mouth and affected my eating." Pt states "I just haven't been feeling good and I feel weaker." Pt alert & oriented, nad noted.

## 2019-11-20 NOTE — ED Provider Notes (Signed)
[facility]   [id number] 11/20/19 Arrival Time: 1446  MV:HQION PAIN  SUBJECTIVE: History from: patient. Timothy Robbins is a 86 y.o. male complains of left sided jaw pain for the last 3 weeks. Has not taken any OTC medications for this. Describes pain as constant, but that the intensity waxes and wanes. There are no aggravating or alleviating symptoms. Denies similar symptoms in the past. Upon chart review, the patient presented to the ER at [facility] earlier today for facial numbness and weakness. Denies these symptoms at this time. Denies fever, chills, erythema, ecchymosis, effusion, weakness, numbness and tingling, saddle paresthesias, loss of bowel or bladder function.      ROS: As per HPI.  All other pertinent ROS negative.     Past Medical History:  Diagnosis Date   Elevated PSA    Past Surgical History:  Procedure Laterality Date   APPENDECTOMY  1955   BACK SURGERY  1980   HERNIA REPAIR Left 2008   No Known Allergies No current facility-administered medications on file prior to encounter.   Current Outpatient Medications on File Prior to Encounter  Medication Sig Dispense Refill   Desmopressin Acetate (NOCDURNA) 55.3 MCG SUBL Place 1 tablet under the tongue at bedtime. Take 1 hour prior to bedtime 8 tablet 0   Social History   Socioeconomic History   Marital status: Married    Spouse name: Not on file   Number of children: Not on file   Years of education: Not on file   Highest education level: Not on file  Occupational History   Not on file  Tobacco Use   Smoking status: Never Smoker   Smokeless tobacco: Never Used  Vaping Use   Vaping Use: Never used  Substance and Sexual Activity   Alcohol use: Yes    Comment: Rarely   Drug use: No   Sexual activity: Not on file  Other Topics Concern   Not on file  Social History Narrative   Not on file   Social Determinants of Health   Financial Resource Strain:    Difficulty of Paying  Living Expenses:   Food Insecurity:    Worried About Programme researcher, broadcasting/film/video in the Last Year:    Barista in the Last Year:   Transportation Needs:    Freight forwarder (Medical):    Lack of Transportation (Non-Medical):   Physical Activity:    Days of Exercise per Week:    Minutes of Exercise per Session:   Stress:    Feeling of Stress :   Social Connections:    Frequency of Communication with Friends and Family:    Frequency of Social Gatherings with Friends and Family:    Attends Religious Services:    Active Member of Clubs or Organizations:    Attends Engineer, structural:    Marital Status:   Intimate Partner Violence:    Fear of Current or Ex-Partner:    Emotionally Abused:    Physically Abused:    Sexually Abused:    Family History  Problem Relation Age of Onset   Bladder Cancer Neg Hx    Kidney cancer Neg Hx    Prostate cancer Neg Hx    Cancer Neg Hx     OBJECTIVE:  Vitals:   11/20/19 1449  BP: (!) 146/89  Pulse: 73  Resp: 14  Temp: 98.2 F (36.8 C)  SpO2: 95%    General appearance: ALERT; in no acute distress.  Head: NCAT  Lungs: Normal respiratory effort CV:  pulses 2+ bilaterally. Cap refill < 2 seconds Musculoskeletal:  Inspection: Skin warm, dry, clear and intact without obvious erythema, effusion, or ecchymosis.  Palpation: Nontender to palpation ROM: FROM active and passive Skin: warm and dry Neurologic: Ambulates without difficulty; Sensation intact about the upper/ lower extremities Psychological: alert and cooperative; normal mood and affect  DIAGNOSTIC STUDIES:  CT HEAD WO CONTRAST  Result Date: 11/20/2019 CLINICAL DATA:  Left facial numbness since 11/15/2019. EXAM: CT HEAD WITHOUT CONTRAST TECHNIQUE: Contiguous axial images were obtained from the base of the skull through the vertex without intravenous contrast. COMPARISON:  None. FINDINGS: Brain: No evidence of acute infarction, hemorrhage,  hydrocephalus, extra-axial collection or mass lesion/mass effect. Vascular: No hyperdense vessel or unexpected calcification. Skull: Intact.  No focal lesion. Sinuses/Orbits: Negative. Other: None. IMPRESSION: Negative head CT. Electronically Signed   By: Drusilla Kanner M.D.   On: 11/20/2019 11:56     ASSESSMENT & PLAN:  1. Jaw pain      Meds ordered this encounter  Medications   naproxen (NAPROSYN) 375 MG tablet    Sig: Take 1 tablet (375 mg total) by mouth 2 (two) times daily.    Dispense:  20 tablet    Refill:  0    Order Specific Question:   Supervising Provider    Answer:   Merrilee Jansky [id number]   Treating for TMJ Prescribed Naproxen Neuro exam intact, no concern for stroke at this time Negative head CT from [facility] ER at 11:56am today Take naproxen as needed for pain relief (may cause abdominal discomfort, ulcers, and GI bleeds avoid taking with other NSAIDs). Follow up with PCP if symptoms persist Return or go to the ER if you have any new or worsening symptoms (fever, chills, chest pain, abdominal pain, changes in bowel or bladder habits, pain radiating into lower legs)   Reviewed expectations re: course of current medical issues. Questions answered. Outlined signs and symptoms indicating need for more acute intervention. Patient verbalized understanding. After Visit Summary given.       Moshe Cipro, NP 11/20/19 1550

## 2019-11-20 NOTE — ED Triage Notes (Signed)
Patient complains of neck pain x3 weeks. Reports at first it was only on the left side but over the last few days it has moved to the right side as well.  OTC: tylenol

## 2019-11-21 ENCOUNTER — Telehealth: Payer: Self-pay

## 2019-11-21 DIAGNOSIS — R6884 Jaw pain: Secondary | ICD-10-CM

## 2019-11-21 MED ORDER — NAPROXEN 375 MG PO TABS: 375.0000 mg | ORAL_TABLET | Freq: Two times a day (BID) | ORAL | 0 refills | Status: DC

## 2019-11-21 NOTE — Telephone Encounter (Signed)
Pt's wife called stating they had picked up Naproxen last night but have lost it.  Spoke with Moshe Cipro NP.  Ok to refill.

## 2019-11-22 ENCOUNTER — Emergency Department: Payer: Medicare PPO

## 2019-11-22 ENCOUNTER — Encounter: Payer: Self-pay | Admitting: Emergency Medicine

## 2019-11-22 ENCOUNTER — Other Ambulatory Visit: Payer: Self-pay

## 2019-11-22 ENCOUNTER — Emergency Department
Admission: EM | Admit: 2019-11-22 | Discharge: 2019-11-22 | Disposition: A | Discharge status: Home / Self Care | Payer: Medicare PPO | Attending: Emergency Medicine | Admitting: Emergency Medicine

## 2019-11-22 DIAGNOSIS — Z79899 Other long term (current) drug therapy: Secondary | ICD-10-CM | POA: Diagnosis not present

## 2019-11-22 DIAGNOSIS — I6782 Cerebral ischemia: Secondary | ICD-10-CM | POA: Diagnosis not present

## 2019-11-22 DIAGNOSIS — R519 Headache, unspecified: Secondary | ICD-10-CM | POA: Diagnosis not present

## 2019-11-22 DIAGNOSIS — M542 Cervicalgia: Secondary | ICD-10-CM | POA: Diagnosis not present

## 2019-11-22 IMAGING — MR MR MRA NECK WO/W CM
5 of 6 series · 33 of 48 positions shown · IV contrast (7.5ml Gadavist)
Comparison: None.

CLINICAL DATA: Neck pain, headache

EXAM:
MRI HEAD WITHOUT CONTRAST
MRA HEAD WITHOUT CONTRAST
MRA NECK WITHOUT AND WITH CONTRAST
TECHNIQUE: Multiplanar, multiecho pulse sequences of the brain and surrounding
structures were obtained without intravenous contrast. Angiographic
images of the Circle of Willis were obtained using MRA technique
without intravenous contrast. Angiographic images of the neck were
obtained using MRA technique without and with intravenous contrast.
Carotid stenosis measurements (when applicable) are obtained
utilizing NASCET criteria, using the distal internal carotid
diameter as the denominator.
CONTRAST:  7.5mL GADAVIST GADOBUTROL 1 MMOL/ML IV SOLN

[Series 10: T1 fat-sat · axial · 3.0mm · 0.39mm/px · z∈[-185,-95]mm · 3 of 30 slices shown (1 of 2)]
[im 1/30]
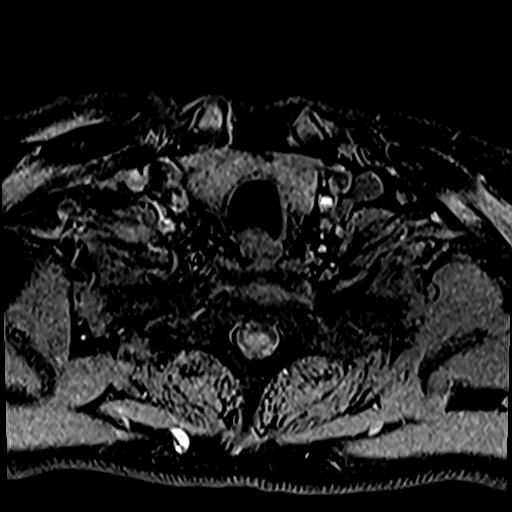
[im 15/30]
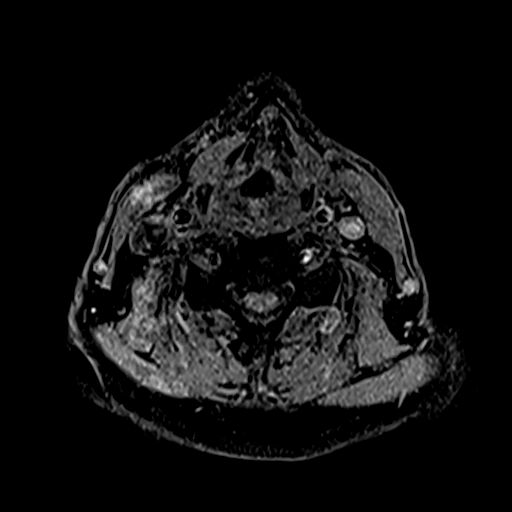
[im 30/30]
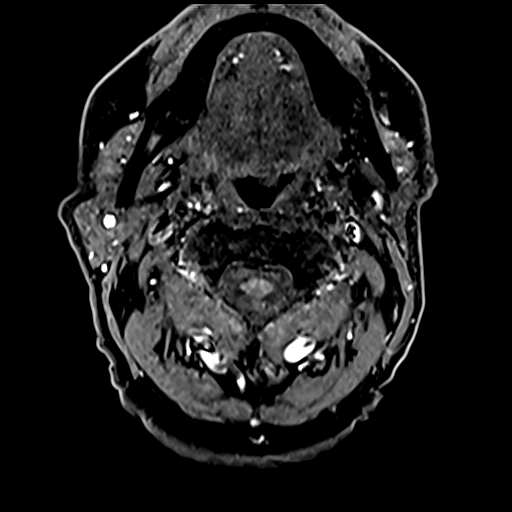

[Series 11: angio_fl3d_cor_pre_ttc=2.0s · coronal · 0.9mm · 0.85mm/px · 9 of 104 slices shown]
[im 1/104]
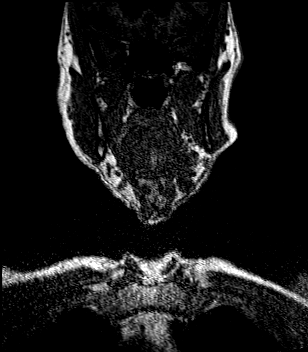
[im 13/104]
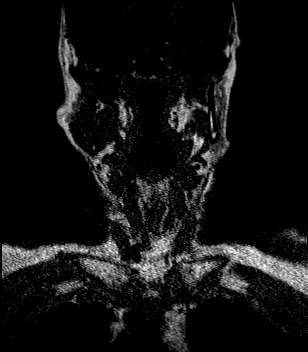
[im 26/104]
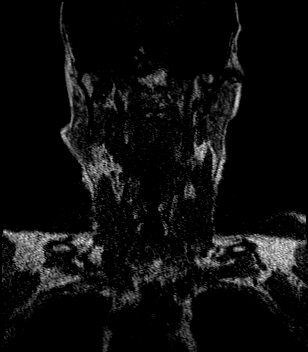
[im 39/104]
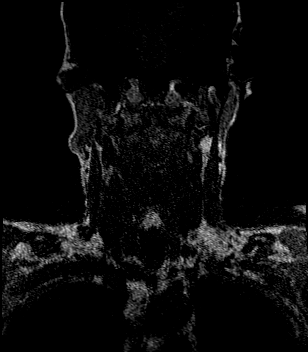
[im 52/104]
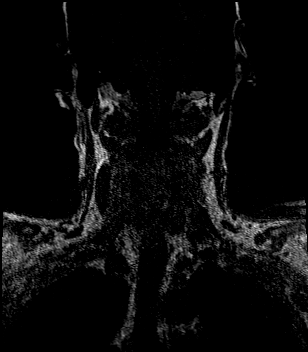
[im 65/104]
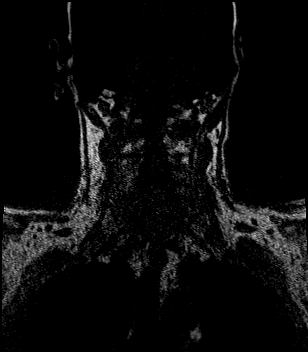
[im 78/104]
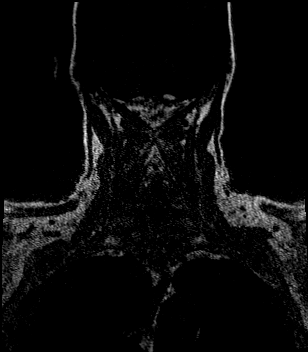
[im 91/104]
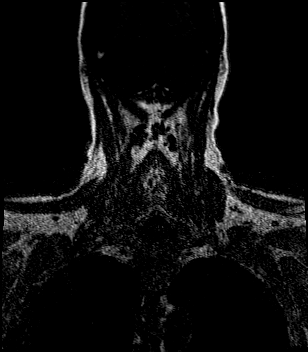
[im 104/104]
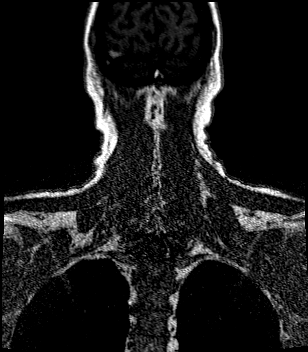

[Series 14: angio_fl3d_cor_post_ttc=2.0s_moco-adv · coronal · 0.9mm · 0.85mm/px · 9 of 104 slices shown]
[im 1/104]
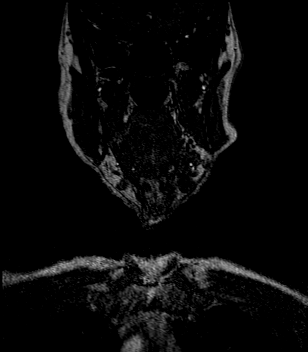
[im 13/104]
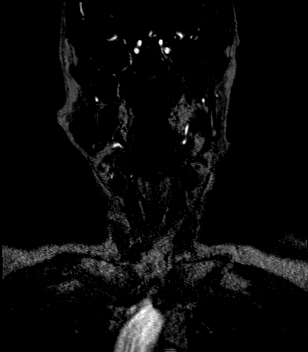
[im 26/104]
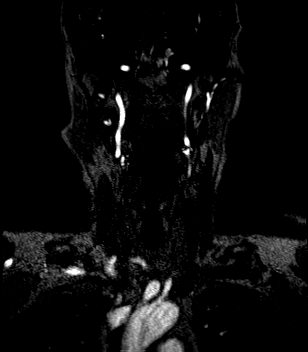
[im 39/104]
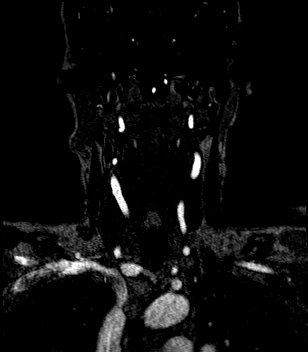
[im 52/104]
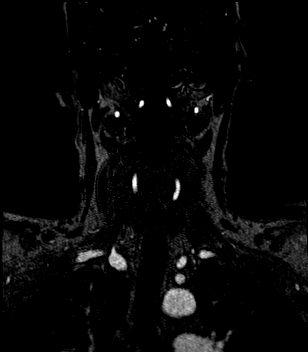
[im 65/104]
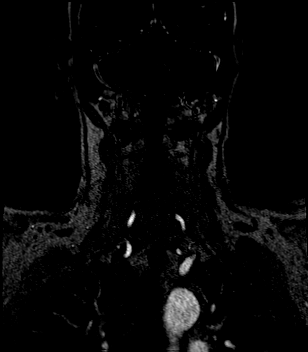
[im 78/104]
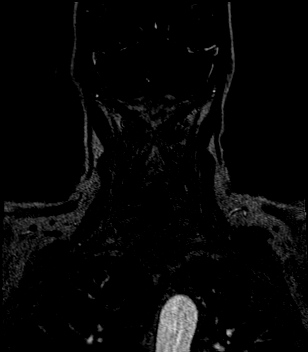
[im 91/104]
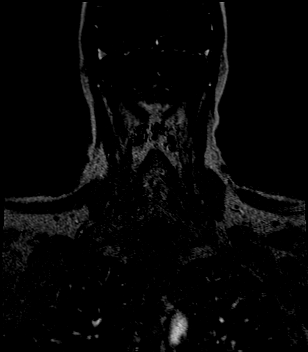
[im 104/104]
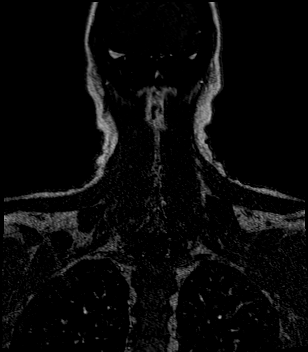

[Series 15: angio_fl3d_cor_post_ttc=2.0s_moco-adv_sub · coronal · 0.9mm · 0.85mm/px · 9 of 103 slices shown]
[im 1/103]
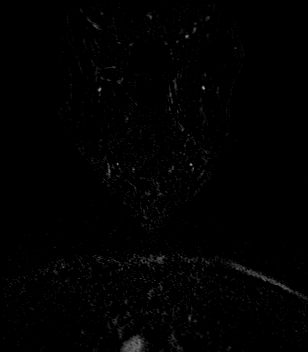
[im 13/103]
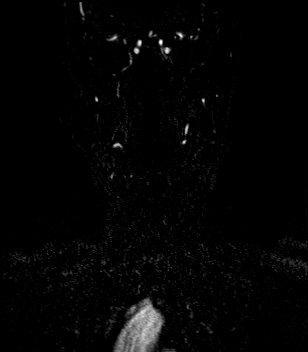
[im 26/103]
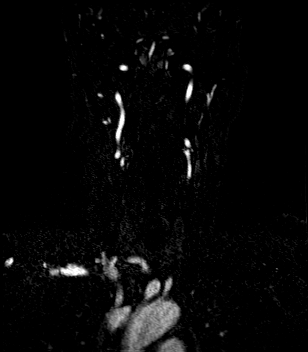
[im 39/103]
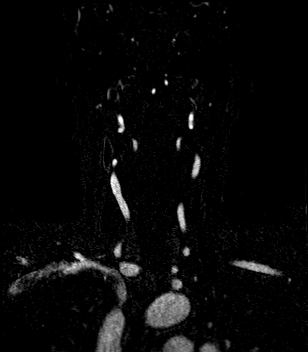
[im 52/103]
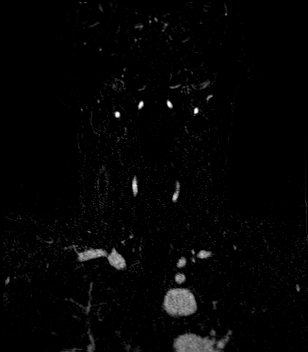
[im 64/103]
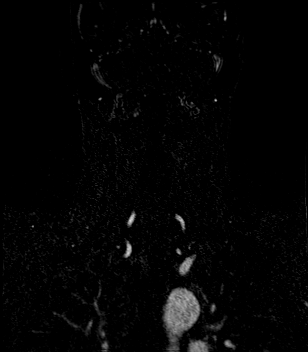
[im 77/103]
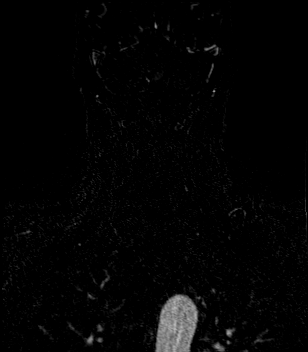
[im 90/103]
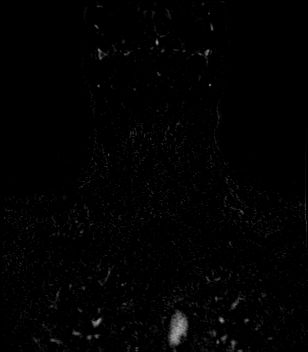
[im 103/103]
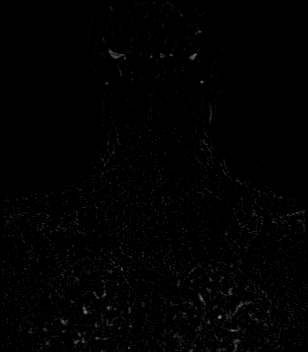

[Series 18: T1 fat-sat · axial · 3.0mm · 0.39mm/px · z∈[-185,-95]mm · 3 of 30 slices shown (2 of 2)]
[im 1/30]
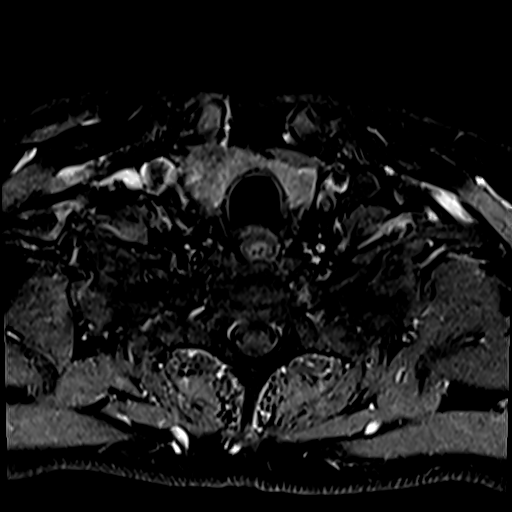
[im 15/30]
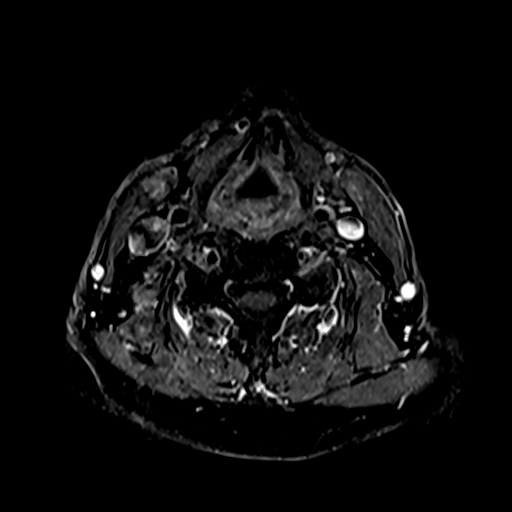
[im 30/30]
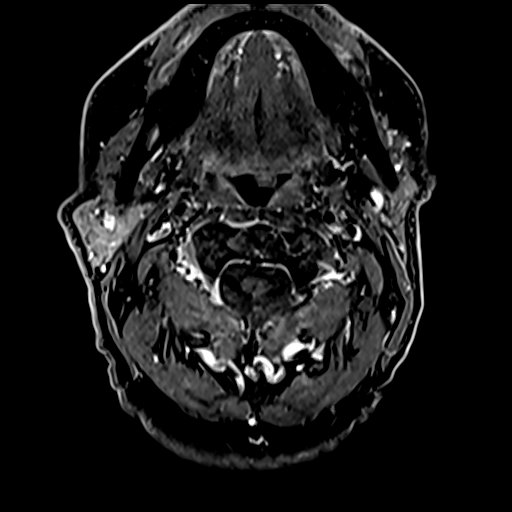

[33 of 48 positions shown; findings below may reference images not displayed]

FINDINGS: MRI HEAD

Brain: There is no acute infarction or intracranial hemorrhage.
There is no intracranial mass, mass effect, or edema. There is no
hydrocephalus or extra-axial fluid collection. Prominence of the
ventricles and sulci reflects generalized parenchymal volume loss.
Patchy foci of T2 hyperintensity in the supratentorial white matter
nonspecific but may reflect mild chronic microvascular ischemic
changes.

Vascular: Major vessel flow voids at the skull base are preserved.

Skull and upper cervical spine: Normal marrow signal is preserved.

Sinuses/Orbits: Minor mucosal thickening.  Orbits are unremarkable.

Other: Sella is unremarkable.  Mastoid air cells are clear.

MRA HEAD

Intracranial internal carotid arteries are patent. Middle and
anterior cerebral arteries are patent. Intracranial vertebral
arteries, basilar artery, posterior cerebral arteries are patent.
There is no significant stenosis or aneurysm.

MRA NECK

Common, internal, and external carotid arteries are patent. Mild
atherosclerotic irregularity at the ICA origins. Extracranial
vertebral arteries are patent. There is no hemodynamically
significant stenosis or evidence of dissection.
IMPRESSION: No evidence of recent infarction, hemorrhage, or mass. Mild chronic
microvascular ischemic changes.

No large vessel occlusion, hemodynamically significant stenosis, or
evidence of dissection.

## 2019-11-22 IMAGING — MR MR MRA HEAD W/O CM
1 series · 19 of 48 positions shown · IV contrast (gadavist)
Comparison: None.

CLINICAL DATA: Neck pain, headache

EXAM:
MRI HEAD WITHOUT CONTRAST
MRA HEAD WITHOUT CONTRAST
MRA NECK WITHOUT AND WITH CONTRAST
TECHNIQUE: Multiplanar, multiecho pulse sequences of the brain and surrounding
structures were obtained without intravenous contrast. Angiographic
images of the Circle of Willis were obtained using MRA technique
without intravenous contrast. Angiographic images of the neck were
obtained using MRA technique without and with intravenous contrast.
Carotid stenosis measurements (when applicable) are obtained
utilizing NASCET criteria, using the distal internal carotid
diameter as the denominator.
CONTRAST:  7.5mL GADAVIST GADOBUTROL 1 MMOL/ML IV SOLN

[Series 1: TOF · axial · 0.5mm · 0.41mm/px · z∈[-49,+48]mm · 19 of 205 slices shown]
[im 1/205]
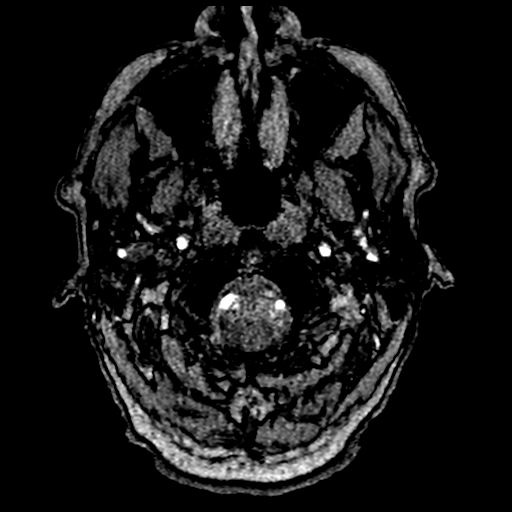
[im 5/205]
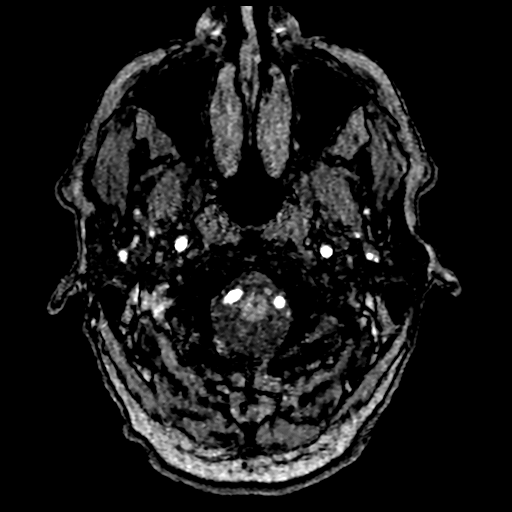
[im 9/205]
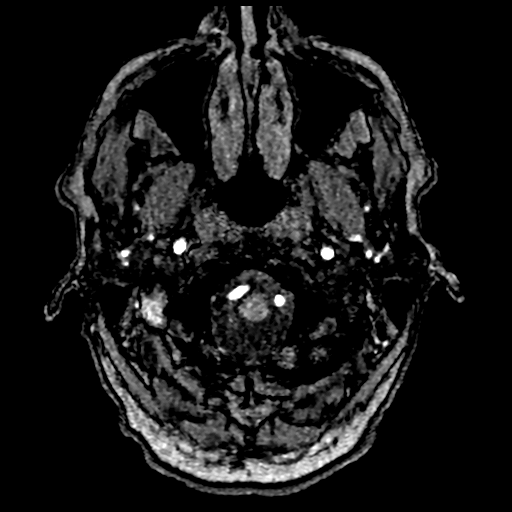
[im 14/205]
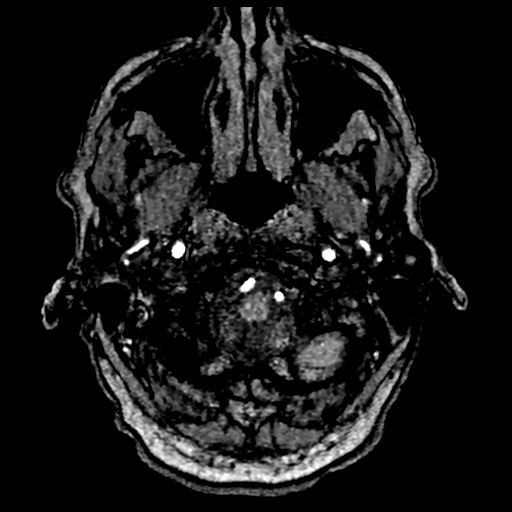
[im 18/205]
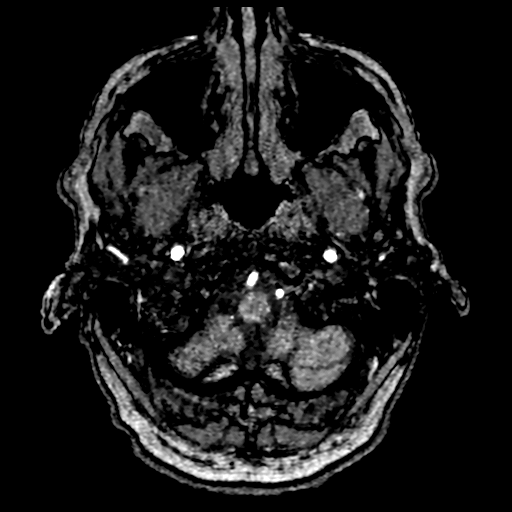
[im 22/205]
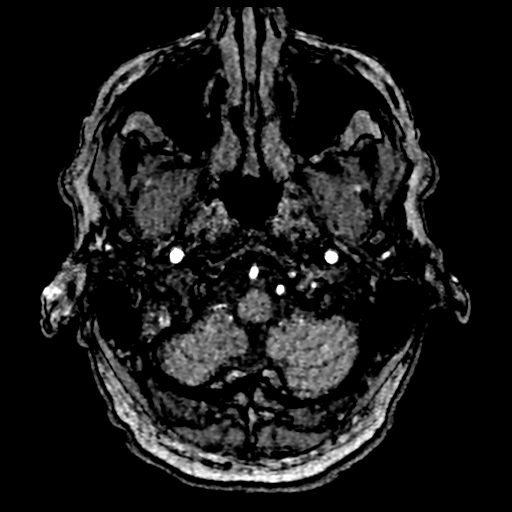
[im 27/205]
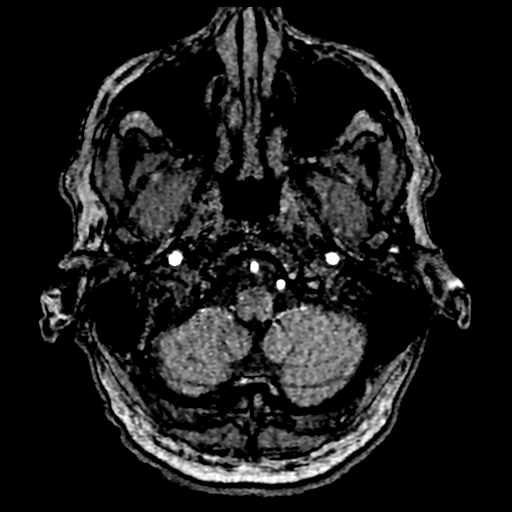
[im 31/205]
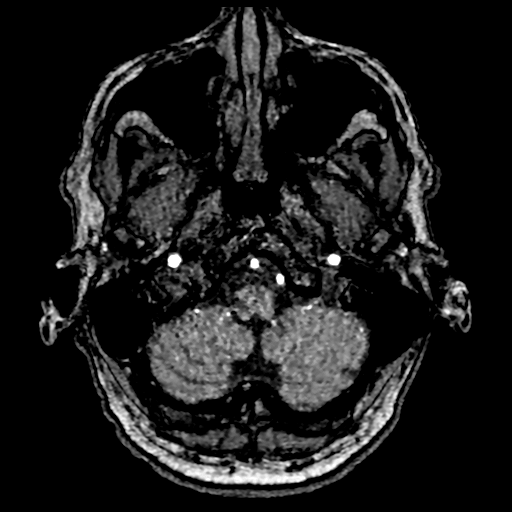
[im 35/205]
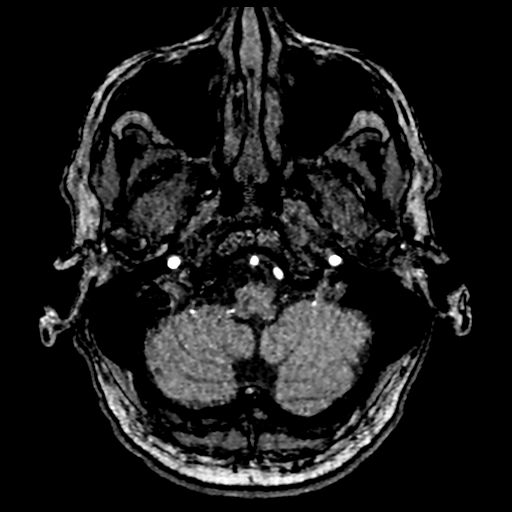
[im 40/205]
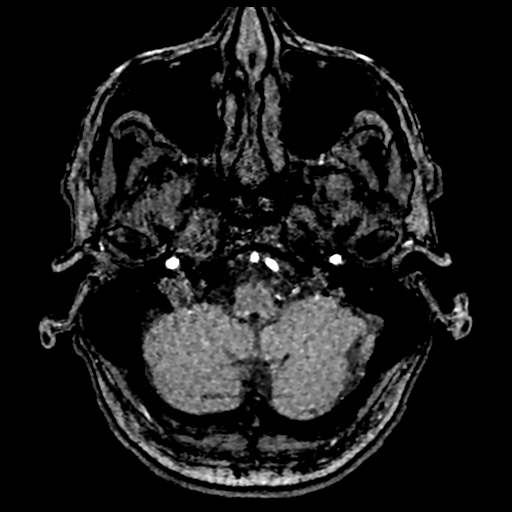
[im 44/205]
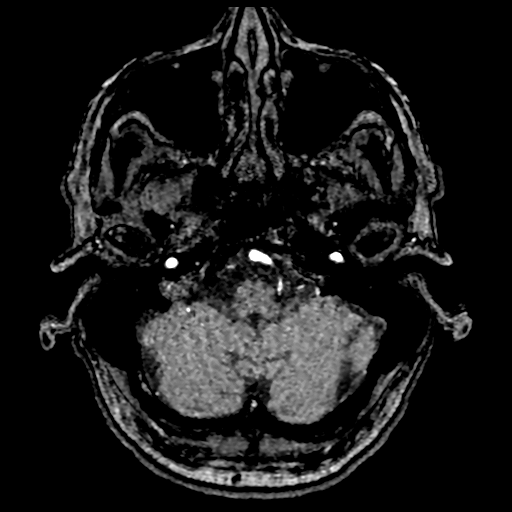
[im 66/205]
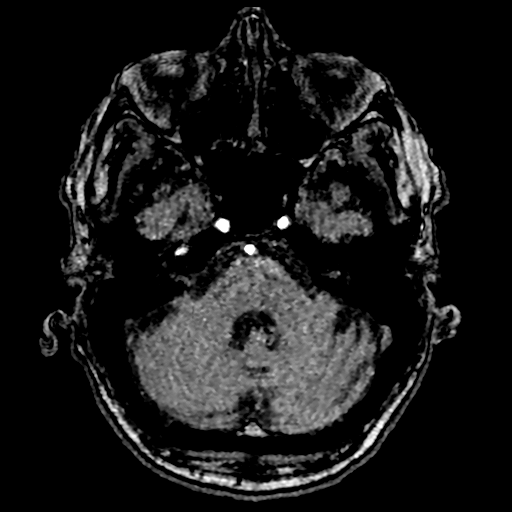
[im 92/205]
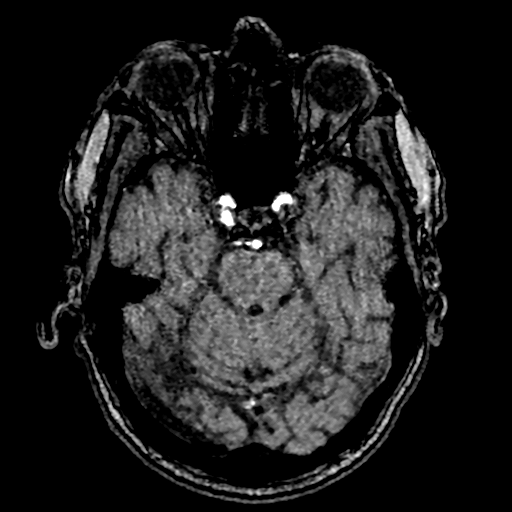
[im 105/205]
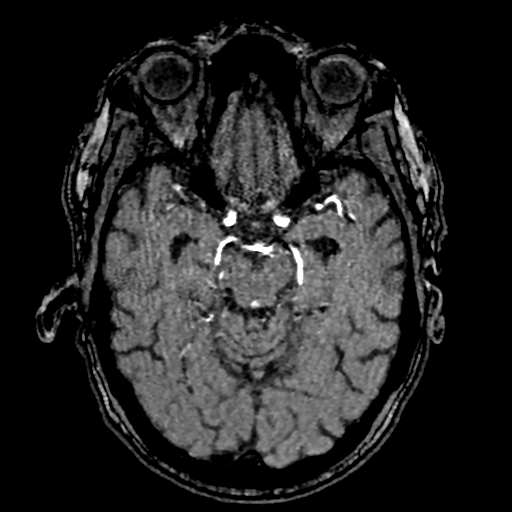
[im 118/205]
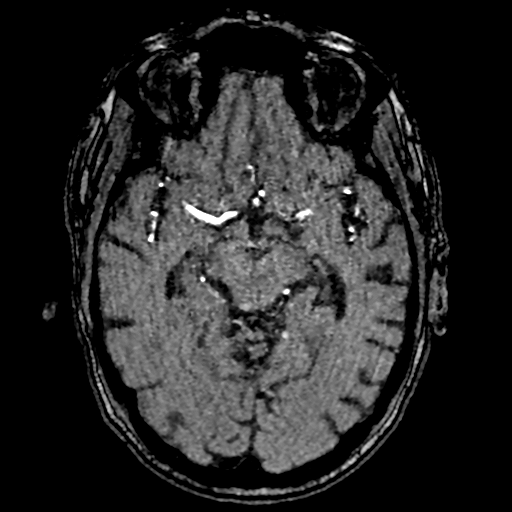
[im 144/205]
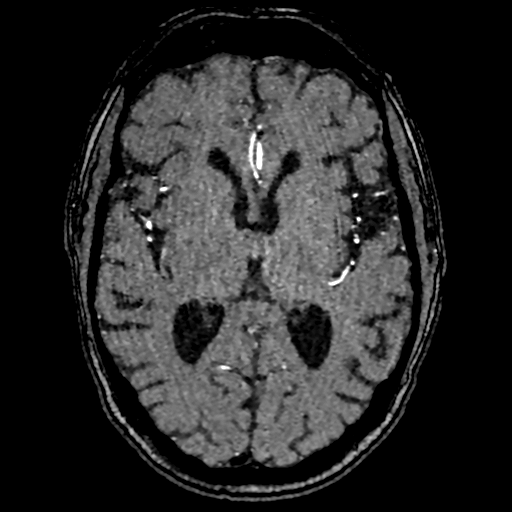
[im 170/205]
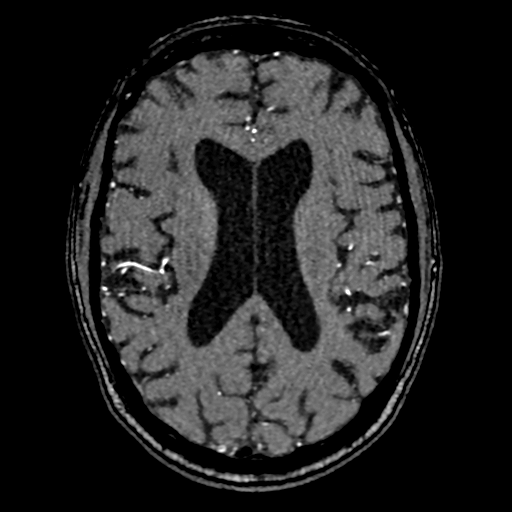
[im 174/205]
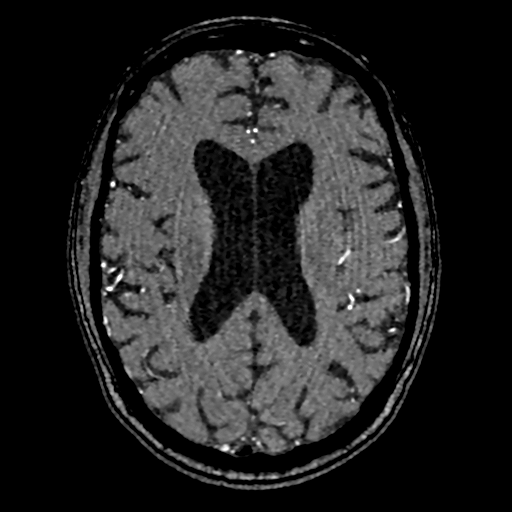
[im 196/205]
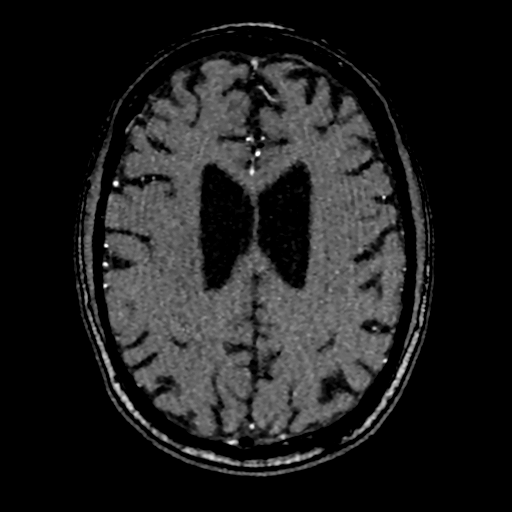

[19 of 48 positions shown; findings below may reference images not displayed]

FINDINGS: MRI HEAD

Brain: There is no acute infarction or intracranial hemorrhage.
There is no intracranial mass, mass effect, or edema. There is no
hydrocephalus or extra-axial fluid collection. Prominence of the
ventricles and sulci reflects generalized parenchymal volume loss.
Patchy foci of T2 hyperintensity in the supratentorial white matter
nonspecific but may reflect mild chronic microvascular ischemic
changes.

Vascular: Major vessel flow voids at the skull base are preserved.

Skull and upper cervical spine: Normal marrow signal is preserved.

Sinuses/Orbits: Minor mucosal thickening.  Orbits are unremarkable.

Other: Sella is unremarkable.  Mastoid air cells are clear.

MRA HEAD

Intracranial internal carotid arteries are patent. Middle and
anterior cerebral arteries are patent. Intracranial vertebral
arteries, basilar artery, posterior cerebral arteries are patent.
There is no significant stenosis or aneurysm.

MRA NECK

Common, internal, and external carotid arteries are patent. Mild
atherosclerotic irregularity at the ICA origins. Extracranial
vertebral arteries are patent. There is no hemodynamically
significant stenosis or evidence of dissection.
IMPRESSION: No evidence of recent infarction, hemorrhage, or mass. Mild chronic
microvascular ischemic changes.

No large vessel occlusion, hemodynamically significant stenosis, or
evidence of dissection.

## 2019-11-22 IMAGING — MR MR HEAD W/O CM
11 series · 48 of 48 positions shown · IV contrast (gadavist)
Comparison: None.

CLINICAL DATA: Neck pain, headache

EXAM:
MRI HEAD WITHOUT CONTRAST
MRA HEAD WITHOUT CONTRAST
MRA NECK WITHOUT AND WITH CONTRAST
TECHNIQUE: Multiplanar, multiecho pulse sequences of the brain and surrounding
structures were obtained without intravenous contrast. Angiographic
images of the Circle of Willis were obtained using MRA technique
without intravenous contrast. Angiographic images of the neck were
obtained using MRA technique without and with intravenous contrast.
Carotid stenosis measurements (when applicable) are obtained
utilizing NASCET criteria, using the distal internal carotid
diameter as the denominator.
CONTRAST:  7.5mL GADAVIST GADOBUTROL 1 MMOL/ML IV SOLN

[Series 5: ax dwi_tracew · axial · 3.0mm · 0.71mm/px · z∈[-62,+102]mm · 4 of 56 slices shown]
[im 1/56]
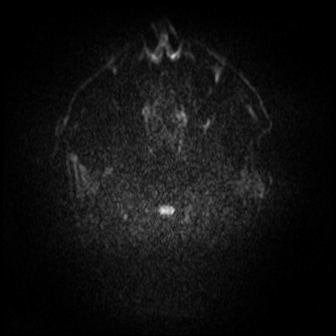
[im 19/56]
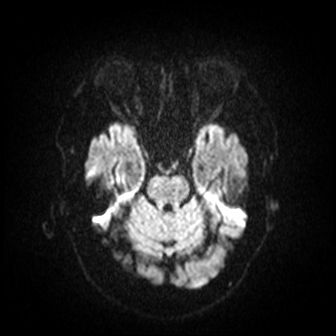
[im 37/56]
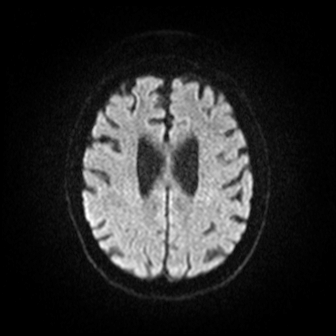
[im 56/56]
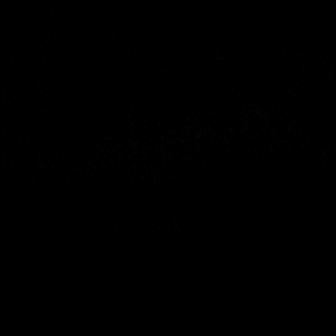

[Series 6: ax dwi_adc · axial · 3.0mm · 0.71mm/px · z∈[-62,+99]mm · 4 of 55 slices shown]
[im 1/55]
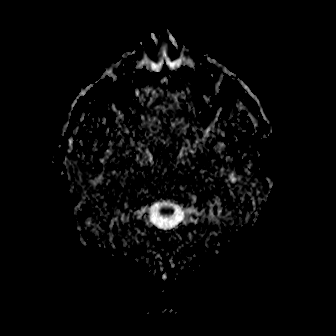
[im 19/55]
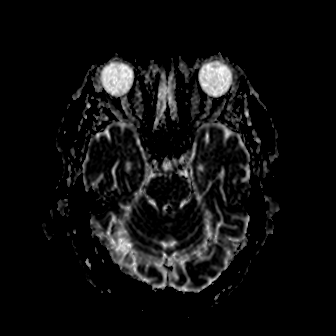
[im 37/55]
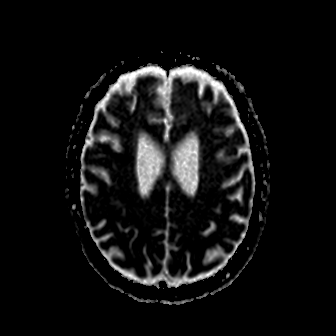
[im 55/55]
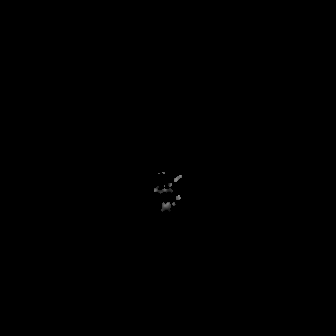

[Series 7: cor dwi_tracew · coronal · 5.0mm · 0.68mm/px · 3 of 40 slices shown]
[im 1/40]
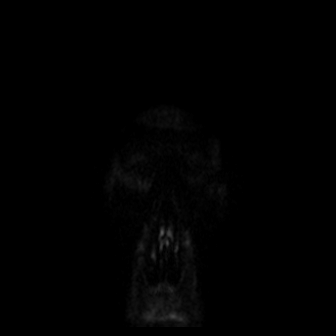
[im 20/40]
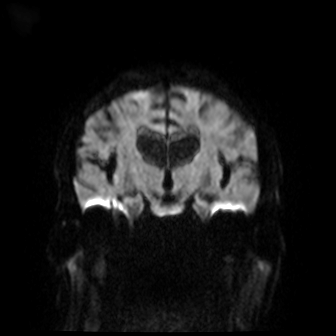
[im 40/40]
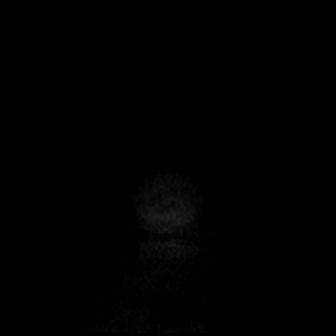

[Series 8: cor dwi_adc · coronal · 5.0mm · 0.68mm/px · 3 of 40 slices shown]
[im 1/40]
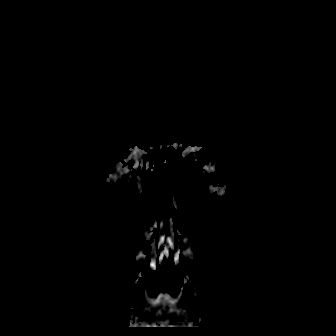
[im 20/40]
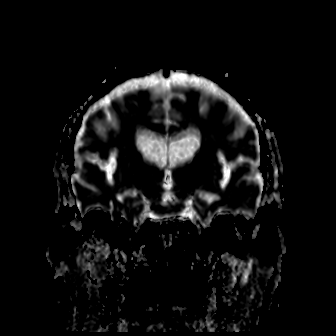
[im 40/40]
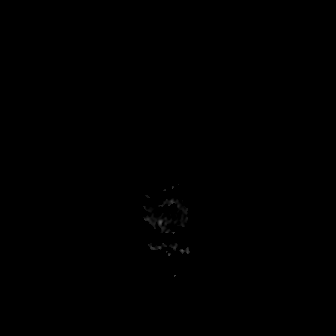

[Series 9: T1 · sagittal · 5.0mm · 0.94mm/px · 2 of 23 slices shown (1 of 2)]
[im 1/23]
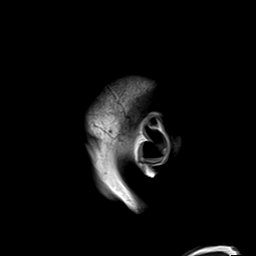
[im 23/23]
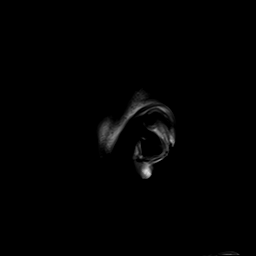

[Series 10: T2 · axial · 5.0mm · 0.53mm/px · z∈[-54,+101]mm · 2 of 27 slices shown (1 of 2)]
[im 1/27]
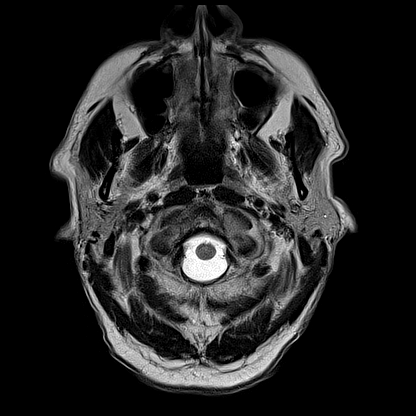
[im 27/27]
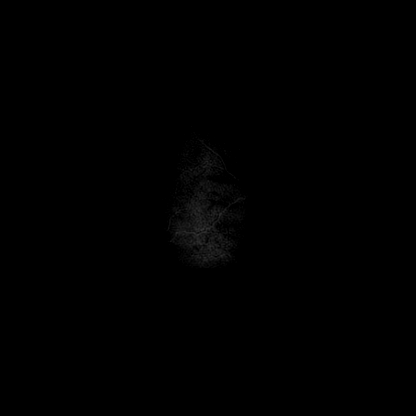

[Series 12: pha_images · axial · 3.0mm · 0.90mm/px · z∈[-65,+111]mm · 5 of 60 slices shown]
[im 1/60]
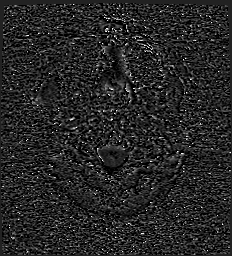
[im 15/60]
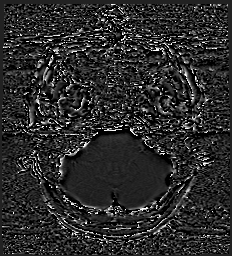
[im 30/60]
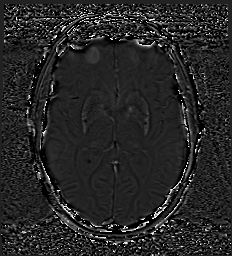
[im 45/60]
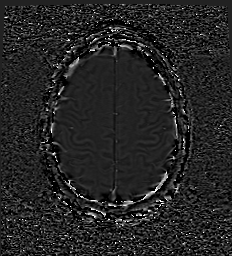
[im 60/60]
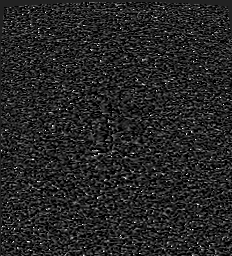

[Series 13: swi_images · axial · 3.0mm · 0.90mm/px · z∈[-65,+111]mm · 5 of 60 slices shown]
[im 1/60]
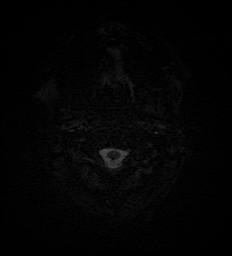
[im 15/60]
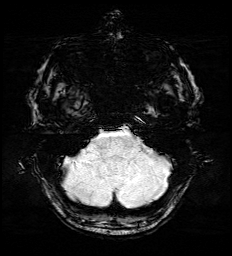
[im 30/60]
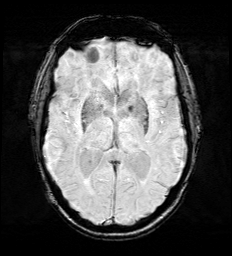
[im 45/60]
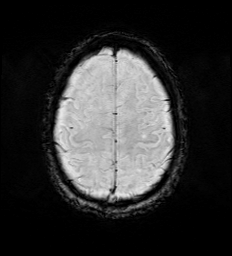
[im 60/60]
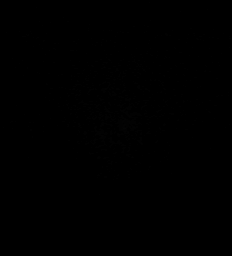

[Series 15: FLAIR · axial · 3.0mm · 0.69mm/px · z∈[-57,+104]mm · 4 of 55 slices shown]
[im 1/55]
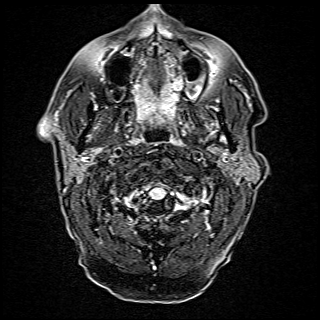
[im 19/55]
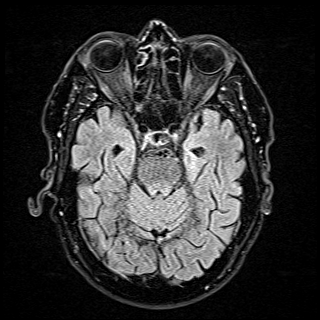
[im 37/55]
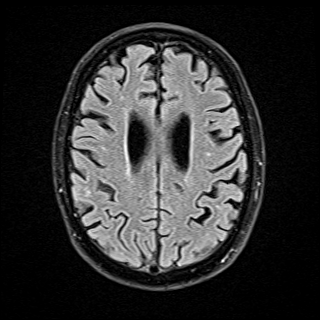
[im 55/55]
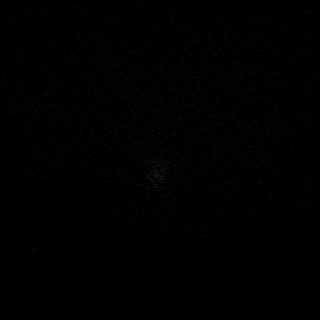

[Series 16: T1 · axial · 1.0mm · 0.98mm/px · z∈[-68,+105]mm · 14 of 175 slices shown (2 of 2)]
[im 1/175]
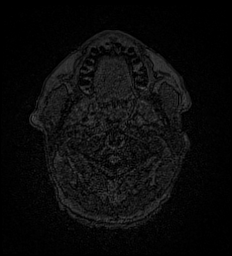
[im 14/175]
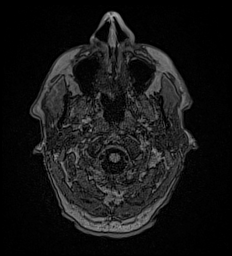
[im 27/175]
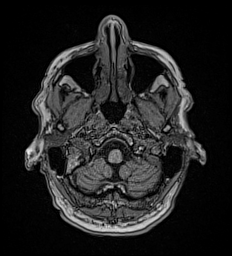
[im 41/175]
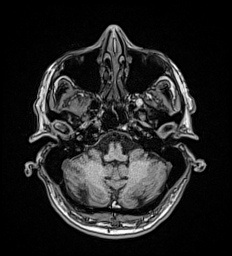
[im 54/175]
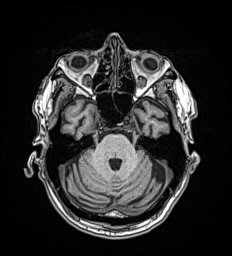
[im 67/175]
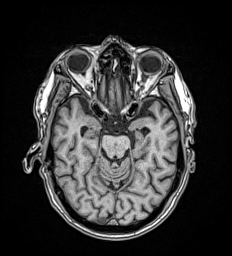
[im 81/175]
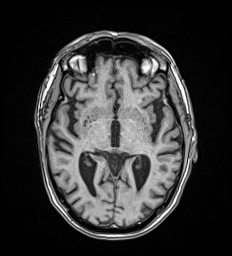
[im 94/175]
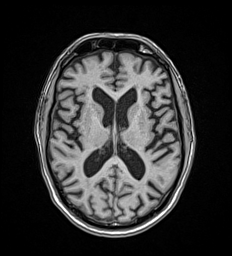
[im 108/175]
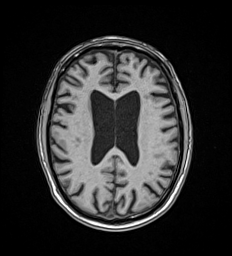
[im 121/175]
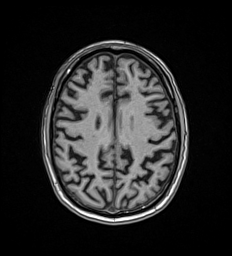
[im 134/175]
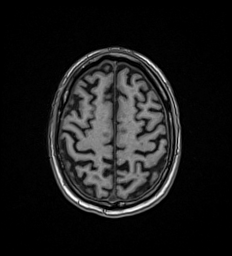
[im 148/175]
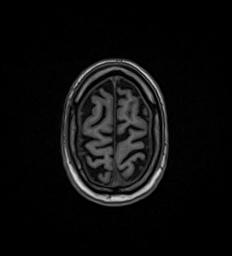
[im 161/175]
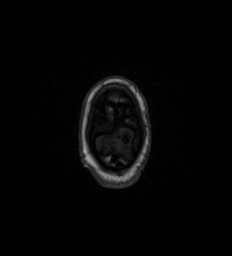
[im 175/175]
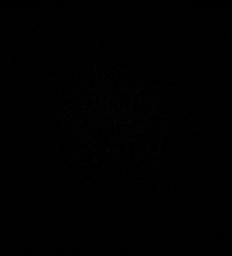

[Series 17: T2 · coronal · 5.0mm · 0.57mm/px · 2 of 31 slices shown (2 of 2)]
[im 1/31]
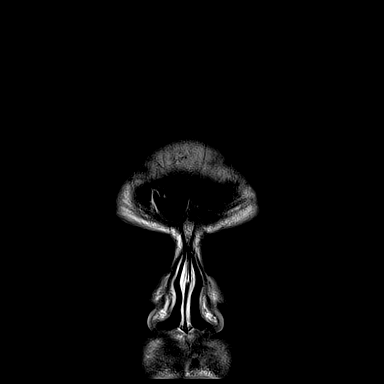
[im 31/31]
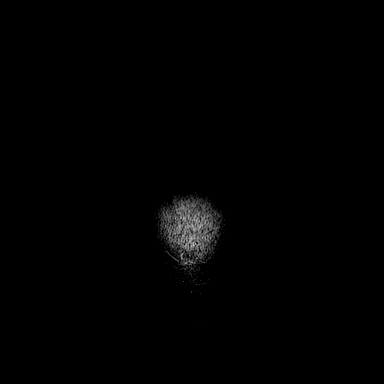

[48 of 48 positions shown; findings below may reference images not displayed]

FINDINGS: MRI HEAD

Brain: There is no acute infarction or intracranial hemorrhage.
There is no intracranial mass, mass effect, or edema. There is no
hydrocephalus or extra-axial fluid collection. Prominence of the
ventricles and sulci reflects generalized parenchymal volume loss.
Patchy foci of T2 hyperintensity in the supratentorial white matter
nonspecific but may reflect mild chronic microvascular ischemic
changes.

Vascular: Major vessel flow voids at the skull base are preserved.

Skull and upper cervical spine: Normal marrow signal is preserved.

Sinuses/Orbits: Minor mucosal thickening.  Orbits are unremarkable.

Other: Sella is unremarkable.  Mastoid air cells are clear.

MRA HEAD

Intracranial internal carotid arteries are patent. Middle and
anterior cerebral arteries are patent. Intracranial vertebral
arteries, basilar artery, posterior cerebral arteries are patent.
There is no significant stenosis or aneurysm.

MRA NECK

Common, internal, and external carotid arteries are patent. Mild
atherosclerotic irregularity at the ICA origins. Extracranial
vertebral arteries are patent. There is no hemodynamically
significant stenosis or evidence of dissection.
IMPRESSION: No evidence of recent infarction, hemorrhage, or mass. Mild chronic
microvascular ischemic changes.

No large vessel occlusion, hemodynamically significant stenosis, or
evidence of dissection.

## 2019-11-22 MED ORDER — GADOBUTROL 1 MMOL/ML IV SOLN
7.5000 mL | Freq: Once | INTRAVENOUS | Status: AC | PRN
  Administered 2019-11-22: 7.5 mL via INTRAVENOUS

## 2019-11-22 NOTE — ED Notes (Signed)
Pt c/o wanting to leave, advised patient that he has had his MRI completed and is waiting to see the Dr. To go over the results. The patietn states he does not want to wait 3 hours to be seen, advised patient that unfortunately it is an E.D. and that patients do have wait and that emergent patients take precedence. I advised patient that I specifically spoke with an ED provider to see about ordering tests to be completed while he waited to further decrease his wait times. Pt continued to be upset about waiting. Pt stated he wanted to come back tomorrow. I told him he would have to start all over again.  Advised patient that he has been to the ED and UC recently this week and his PCP to try to come up with a solution to his neck pain and that it would be helpful to him if he stayed to get his test results. Advised patient that there were still several people ahead of him to go back but several may have a left.   Pt states he will wait 30-45 minutes more.

## 2019-11-22 NOTE — ED Triage Notes (Signed)
Pt in via POV, reports neck pain x one week, with difficulty turning neck due to pain.  Denies any recent injury.  Ambulatory to triage, NAD noted at this time.

## 2019-11-22 NOTE — ED Notes (Addendum)
This RN came to bedside to assess pt and update pt VS. Pt was yelling in hallway, ripped IV out. Pt did allow Paulino Rily to place gauze and tape to stop bleeding. Bleeding controlled. Christiane Ha, PA at bedside to assess pt. Pt continues yelling about wanting to leave and not seeing a doctor. Christiane Ha, PA explained to pt that he was at bedside to assess pt. Morrie Sheldon, RN attempted to get pt to sign AMA form on computer. Pt refused.  Pt got up from bed and ambulated toward lobby continuing to be upset. Ambulated with steady gait noted. Bleeding from IV site was controlled upon leaving.

## 2019-11-22 NOTE — ED Notes (Signed)
Spoke with Dr. Scotty Court re: patient, MRI ordered, no labs at this time.

## 2019-11-22 NOTE — ED Provider Notes (Signed)
[facility] Emergency Department Provider Note  ____________________________________________  Time seen: Approximately 6:50 PM  I have reviewed the triage vital signs and the nursing notes.   HISTORY  Chief Complaint Torticollis    HPI Timothy Robbins is a 86 y.o. male who presented to emergency department complaining of neck pain and stiffness.   Patient had reportedly been seen at [facility] emergency department for similar complaints with a reassuring work-up at that time.  Patient been discharged with anti-inflammatories.  Patient presented to emergency department today stating that he had ongoing neck pain and stiffness.  Patient had orders for MRI which were completed.  Patient became very upset, stating that he had been waiting for "3 hours" and that he "made a mistake and coming."  When I advised him that I was the provider seeing him and trying to ask further questions patient just became more upset, refused to answer any further questions, pulled out his own IV and walked out the door.  I advised the patient to wait, that I was here to see him, patient was upset and refused to talk anymore and walked out.  Patient had multiple attempts by other staff as well to have him remain and patient continued to walk outdoors.        Past Medical History:  Diagnosis Date   Elevated PSA     Patient Active Problem List   Diagnosis Date Noted   Elevated PSA     Past Surgical History:  Procedure Laterality Date   APPENDECTOMY  1955   BACK SURGERY  1980   HERNIA REPAIR Left 2008    Prior to Admission medications   Medication Sig Start Date End Date Taking? Authorizing Provider  Desmopressin Acetate (NOCDURNA) 55.3 MCG SUBL Place 1 tablet under the tongue at bedtime. Take 1 hour prior to bedtime 12/16/17   Stoioff, Verna Czech, MD  naproxen (NAPROSYN) 375 MG tablet Take 1 tablet (375 mg total) by mouth 2 (two) times daily. 11/21/19   Moshe Cipro, NP     Allergies Patient has no known allergies.  Family History  Problem Relation Age of Onset   Bladder Cancer Neg Hx    Kidney cancer Neg Hx    Prostate cancer Neg Hx    Cancer Neg Hx     Social History Social History   Tobacco Use   Smoking status: Never Smoker   Smokeless tobacco: Never Used  Building services engineer Use: Never used  Substance Use Topics   Alcohol use: Yes    Comment: Rarely   Drug use: No     Review of Systems  Musculoskeletal: Positive for neck pain and stiffness 10-point ROS otherwise negative.  ____________________________________________   PHYSICAL EXAM:  VITAL SIGNS: ED Triage Vitals  Enc Vitals Group     BP 11/22/19 1555 (!) 126/106     Pulse Rate 11/22/19 1555 61     Resp 11/22/19 1555 16     Temp 11/22/19 1555 98.2 F (36.8 C)     Temp Source 11/22/19 1555 Oral     SpO2 11/22/19 1555 96 %     Weight 11/22/19 1556 180 lb (81.6 kg)     Height 11/22/19 1556 6\' 1"  (1.854 m)     Head Circumference --      Peak Flow --      Pain Score 11/22/19 1556 4     Pain Loc --      Pain Edu? --  Excl. in GC? --      Constitutional: Alert and oriented. Well appearing and in no acute distress. Eyes: Conjunctivae are normal. PERRL. EOMI. Head: Atraumatic. Neck: No stridor.  Full range of motion of cervical spine.  Patient would not allow detailed exam.  Respiratory: Normal respiratory effort without tachypnea or retractions. Musculoskeletal: Full range of motion to all extremities. No gross deformities appreciated. Neurologic:  Normal speech and language. No gross focal neurologic deficits are appreciated.  Skin:  Skin is warm, dry and intact. No rash noted.    ____________________________________________   LABS (all labs ordered are listed, but only abnormal results are displayed)  Labs Reviewed - No data to  display ____________________________________________  EKG   ____________________________________________  RADIOLOGY I personally viewed and evaluated these images as part of my medical decision making, as well as reviewing the written report by the radiologist.  MR ANGIO HEAD WO CONTRAST  Result Date: 11/22/2019 CLINICAL DATA:  Neck pain, headache EXAM: MRI HEAD WITHOUT CONTRAST MRA HEAD WITHOUT CONTRAST MRA NECK WITHOUT AND WITH CONTRAST TECHNIQUE: Multiplanar, multiecho pulse sequences of the brain and surrounding structures were obtained without intravenous contrast. Angiographic images of the Circle of Willis were obtained using MRA technique without intravenous contrast. Angiographic images of the neck were obtained using MRA technique without and with intravenous contrast. Carotid stenosis measurements (when applicable) are obtained utilizing NASCET criteria, using the distal internal carotid diameter as the denominator. CONTRAST:  7.54mL GADAVIST GADOBUTROL 1 MMOL/ML IV SOLN COMPARISON:  None. FINDINGS: MRI HEAD Brain: There is no acute infarction or intracranial hemorrhage. There is no intracranial mass, mass effect, or edema. There is no hydrocephalus or extra-axial fluid collection. Prominence of the ventricles and sulci reflects generalized parenchymal volume loss. Patchy foci of T2 hyperintensity in the supratentorial white matter nonspecific but may reflect mild chronic microvascular ischemic changes. Vascular: Major vessel flow voids at the skull base are preserved. Skull and upper cervical spine: Normal marrow signal is preserved. Sinuses/Orbits: Minor mucosal thickening.  Orbits are unremarkable. Other: Sella is unremarkable.  Mastoid air cells are clear. MRA HEAD Intracranial internal carotid arteries are patent. Middle and anterior cerebral arteries are patent. Intracranial vertebral arteries, basilar artery, posterior cerebral arteries are patent. There is no significant stenosis or  aneurysm. MRA NECK Common, internal, and external carotid arteries are patent. Mild atherosclerotic irregularity at the ICA origins. Extracranial vertebral arteries are patent. There is no hemodynamically significant stenosis or evidence of dissection. IMPRESSION: No evidence of recent infarction, hemorrhage, or mass. Mild chronic microvascular ischemic changes. No large vessel occlusion, hemodynamically significant stenosis, or evidence of dissection. Electronically Signed   By: Guadlupe Spanish M.D.   On: 11/22/2019 17:42   MR Angiogram Neck W or Wo Contrast  Result Date: 11/22/2019 CLINICAL DATA:  Neck pain, headache EXAM: MRI HEAD WITHOUT CONTRAST MRA HEAD WITHOUT CONTRAST MRA NECK WITHOUT AND WITH CONTRAST TECHNIQUE: Multiplanar, multiecho pulse sequences of the brain and surrounding structures were obtained without intravenous contrast. Angiographic images of the Circle of Willis were obtained using MRA technique without intravenous contrast. Angiographic images of the neck were obtained using MRA technique without and with intravenous contrast. Carotid stenosis measurements (when applicable) are obtained utilizing NASCET criteria, using the distal internal carotid diameter as the denominator. CONTRAST:  7.62mL GADAVIST GADOBUTROL 1 MMOL/ML IV SOLN COMPARISON:  None. FINDINGS: MRI HEAD Brain: There is no acute infarction or intracranial hemorrhage. There is no intracranial mass, mass effect, or edema. There is no hydrocephalus or extra-axial fluid  collection. Prominence of the ventricles and sulci reflects generalized parenchymal volume loss. Patchy foci of T2 hyperintensity in the supratentorial white matter nonspecific but may reflect mild chronic microvascular ischemic changes. Vascular: Major vessel flow voids at the skull base are preserved. Skull and upper cervical spine: Normal marrow signal is preserved. Sinuses/Orbits: Minor mucosal thickening.  Orbits are unremarkable. Other: Sella is unremarkable.   Mastoid air cells are clear. MRA HEAD Intracranial internal carotid arteries are patent. Middle and anterior cerebral arteries are patent. Intracranial vertebral arteries, basilar artery, posterior cerebral arteries are patent. There is no significant stenosis or aneurysm. MRA NECK Common, internal, and external carotid arteries are patent. Mild atherosclerotic irregularity at the ICA origins. Extracranial vertebral arteries are patent. There is no hemodynamically significant stenosis or evidence of dissection. IMPRESSION: No evidence of recent infarction, hemorrhage, or mass. Mild chronic microvascular ischemic changes. No large vessel occlusion, hemodynamically significant stenosis, or evidence of dissection. Electronically Signed   By: Guadlupe Spanish M.D.   On: 11/22/2019 17:42   MR BRAIN WO CONTRAST  Result Date: 11/22/2019 CLINICAL DATA:  Neck pain, headache EXAM: MRI HEAD WITHOUT CONTRAST MRA HEAD WITHOUT CONTRAST MRA NECK WITHOUT AND WITH CONTRAST TECHNIQUE: Multiplanar, multiecho pulse sequences of the brain and surrounding structures were obtained without intravenous contrast. Angiographic images of the Circle of Willis were obtained using MRA technique without intravenous contrast. Angiographic images of the neck were obtained using MRA technique without and with intravenous contrast. Carotid stenosis measurements (when applicable) are obtained utilizing NASCET criteria, using the distal internal carotid diameter as the denominator. CONTRAST:  7.5mL GADAVIST GADOBUTROL 1 MMOL/ML IV SOLN COMPARISON:  None. FINDINGS: MRI HEAD Brain: There is no acute infarction or intracranial hemorrhage. There is no intracranial mass, mass effect, or edema. There is no hydrocephalus or extra-axial fluid collection. Prominence of the ventricles and sulci reflects generalized parenchymal volume loss. Patchy foci of T2 hyperintensity in the supratentorial white matter nonspecific but may reflect mild chronic microvascular  ischemic changes. Vascular: Major vessel flow voids at the skull base are preserved. Skull and upper cervical spine: Normal marrow signal is preserved. Sinuses/Orbits: Minor mucosal thickening.  Orbits are unremarkable. Other: Sella is unremarkable.  Mastoid air cells are clear. MRA HEAD Intracranial internal carotid arteries are patent. Middle and anterior cerebral arteries are patent. Intracranial vertebral arteries, basilar artery, posterior cerebral arteries are patent. There is no significant stenosis or aneurysm. MRA NECK Common, internal, and external carotid arteries are patent. Mild atherosclerotic irregularity at the ICA origins. Extracranial vertebral arteries are patent. There is no hemodynamically significant stenosis or evidence of dissection. IMPRESSION: No evidence of recent infarction, hemorrhage, or mass. Mild chronic microvascular ischemic changes. No large vessel occlusion, hemodynamically significant stenosis, or evidence of dissection. Electronically Signed   By: Guadlupe Spanish M.D.   On: 11/22/2019 17:42    ____________________________________________    PROCEDURES  Procedure(s) performed:    Procedures    Medications  gadobutrol (GADAVIST) 1 MMOL/ML injection 7.5 mL (7.5 mLs Intravenous Contrast Given 11/22/19 1729)     ____________________________________________   INITIAL IMPRESSION / ASSESSMENT AND PLAN / ED COURSE  Pertinent labs & imaging results that were available during my care of the patient were reviewed by me and considered in my medical decision making (see chart for details).  Review of the [place] CSRS was performed in accordance of the [facility] prior to dispensing any controlled drugs.  Clinical Course as of Nov 21 1853  Sat Nov 22, 2019  0350 MR Angiogram  Neck W or Wo Contrast [OW]    Clinical Course User Index [OW] Gwenlyn FudgeWilliams, Olivia, Student-PA          Patient's diagnosis is consistent with neck pain.  Patient presented to emergency department  complaining of neck pain x1 week.  Patient had pain, decreased range of motion to the right side.  No recent injury reported to triage.  Patient had MRIs ordered.  When I presented to see the patient patient was very upset stating that he had waited too long.  Patient states that he had been here for 3 hours and had not seen a doctor.  When advised him that I was the provider seeing the patient and tried to gain further insight into the patient's symptoms as well as performed physical exam patient pulled out his own IV and left AGAINST MEDICAL ADVICE.  Patient refused to sign any AMA paperwork.  Multiple staff also tried to intervene and convince the patient to stay but patient left.  Patient had full range of motion to the cervical spine during this, but I was unable to perform a detailed exam.  Patient also did not receive the results of his MRI before leaving AGAINST MEDICAL ADVICE.  MRI was reassuring with no acute emergent findings.  [facility] and been given anti-inflammatories.  Again patient was very upset, left AGAINST MEDICAL ADVICE prior to the completion of assessment and treatment.  There are no prescriptions ordered at this time..      ____________________________________________  FINAL CLINICAL IMPRESSION(S) / ED DIAGNOSES  Final diagnoses:  Neck pain      NEW MEDICATIONS STARTED DURING THIS VISIT:  ED Discharge Orders    None          This chart was dictated using voice recognition software/Dragon. Despite best efforts to proofread, errors can occur which can change the meaning. Any change was purely unintentional.    Racheal PatchesCuthriell, Loeta Herst D, PA-C 11/22/19 Carloyn [id number]    Stafford, Phillip, MD 11/23/19 0021

## 2019-11-23 ENCOUNTER — Emergency Department: Payer: Medicare PPO

## 2019-11-23 ENCOUNTER — Encounter: Payer: Self-pay | Admitting: Emergency Medicine

## 2019-11-23 ENCOUNTER — Emergency Department
Admission: EM | Admit: 2019-11-23 | Discharge: 2019-11-23 | Disposition: A | Discharge status: Home / Self Care | Payer: Medicare PPO | Attending: Emergency Medicine | Admitting: Emergency Medicine

## 2019-11-23 DIAGNOSIS — M542 Cervicalgia: Secondary | ICD-10-CM | POA: Diagnosis present

## 2019-11-23 DIAGNOSIS — R410 Disorientation, unspecified: Secondary | ICD-10-CM | POA: Diagnosis not present

## 2019-11-23 LAB — CBC
HCT: 40.6 % (ref 39.0–52.0)
Hemoglobin: 14.2 g/dL (ref 13.0–17.0)
MCH: 32.3 pg (ref 26.0–34.0)
MCHC: 35 g/dL (ref 30.0–36.0)
MCV: 92.3 fL (ref 80.0–100.0)
Platelets: 168 10*3/uL (ref 150–400)
RBC: 4.4 MIL/uL (ref 4.22–5.81)
RDW: 12.7 % (ref 11.5–15.5)
WBC: 8.3 10*3/uL (ref 4.0–10.5)
nRBC: 0 % (ref 0.0–0.2)

## 2019-11-23 LAB — URINALYSIS, ROUTINE W REFLEX MICROSCOPIC
Bacteria, UA: NONE SEEN
Bilirubin Urine: NEGATIVE
Glucose, UA: NEGATIVE mg/dL
Hgb urine dipstick: NEGATIVE
Ketones, ur: NEGATIVE mg/dL
Leukocytes,Ua: NEGATIVE
Nitrite: NEGATIVE
Protein, ur: 30 mg/dL — AB
Specific Gravity, Urine: 1.03 (ref 1.005–1.030)
pH: 5 (ref 5.0–8.0)

## 2019-11-23 LAB — COMPREHENSIVE METABOLIC PANEL
ALT: 15 U/L (ref 0–44)
AST: 28 U/L (ref 15–41)
Albumin: 4.6 g/dL (ref 3.5–5.0)
Alkaline Phosphatase: 76 U/L (ref 38–126)
Anion gap: 7 (ref 5–15)
BUN: 27 mg/dL — ABNORMAL HIGH (ref 8–23)
CO2: 26 mmol/L (ref 22–32)
Calcium: 9.3 mg/dL (ref 8.9–10.3)
Chloride: 105 mmol/L (ref 98–111)
Creatinine, Ser: 1.38 mg/dL — ABNORMAL HIGH (ref 0.61–1.24)
GFR calc Af Amer: 54 mL/min — ABNORMAL LOW (ref >60)
GFR calc non Af Amer: 47 mL/min — ABNORMAL LOW (ref >60)
Glucose, Bld: 90 mg/dL (ref 70–99)
Potassium: 4.3 mmol/L (ref 3.5–5.1)
Sodium: 138 mmol/L (ref 135–145)
Total Bilirubin: 1 mg/dL (ref 0.3–1.2)
Total Protein: 7.9 g/dL (ref 6.5–8.1)

## 2019-11-23 IMAGING — CR DG CERVICAL SPINE COMPLETE 4+V
1 series · 6 of 6 positions shown · non-contrast
Comparison: None.

CLINICAL DATA: Neck pain for the past 5 days.  No known injury.

EXAM:
CERVICAL SPINE - COMPLETE 4+ VIEW

[Series 1: dg cervical spine complete · 0.14mm/px · 6 of 6 slices shown]
[im 1/6]
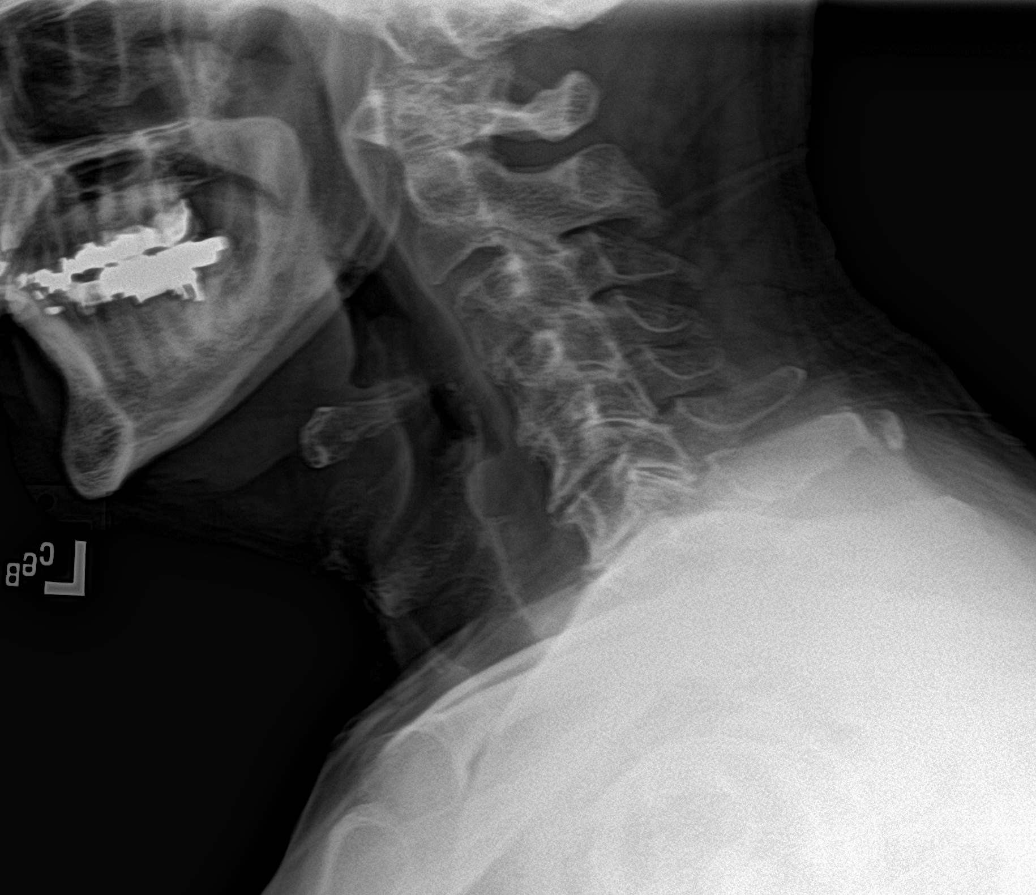
[im 2/6]
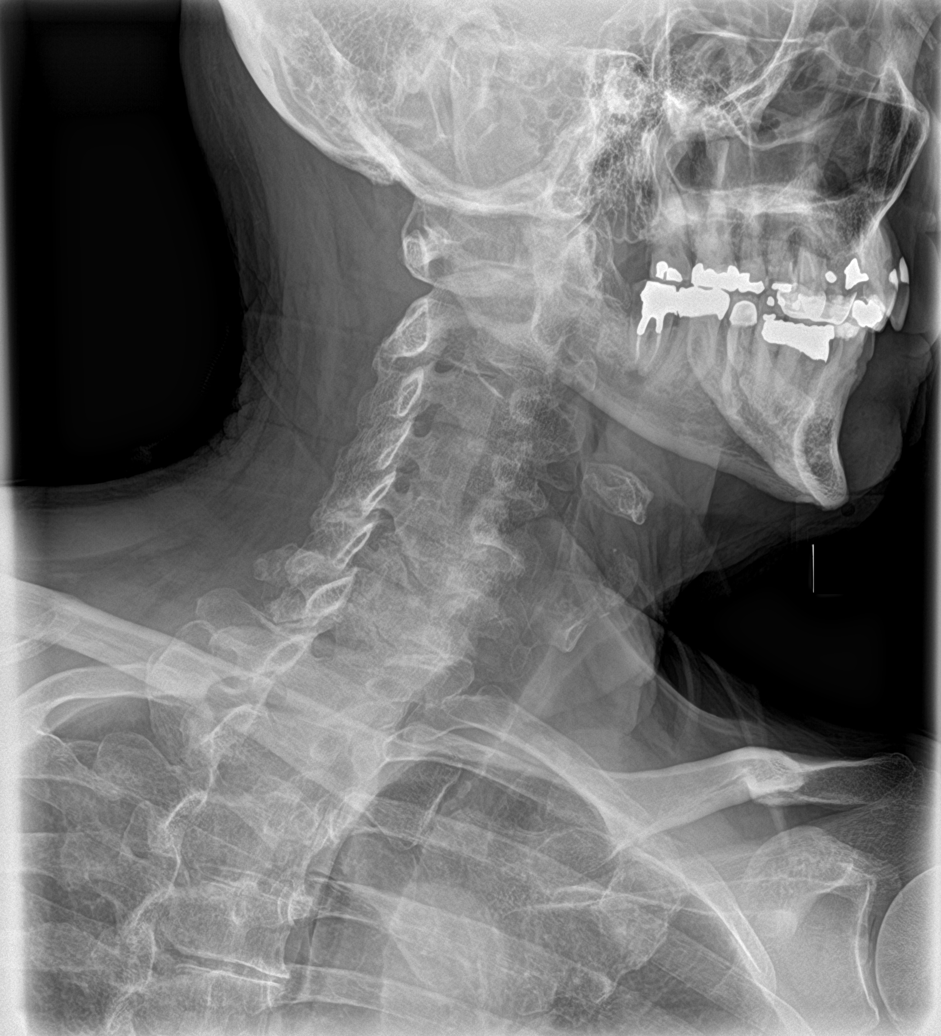
[im 3/6]
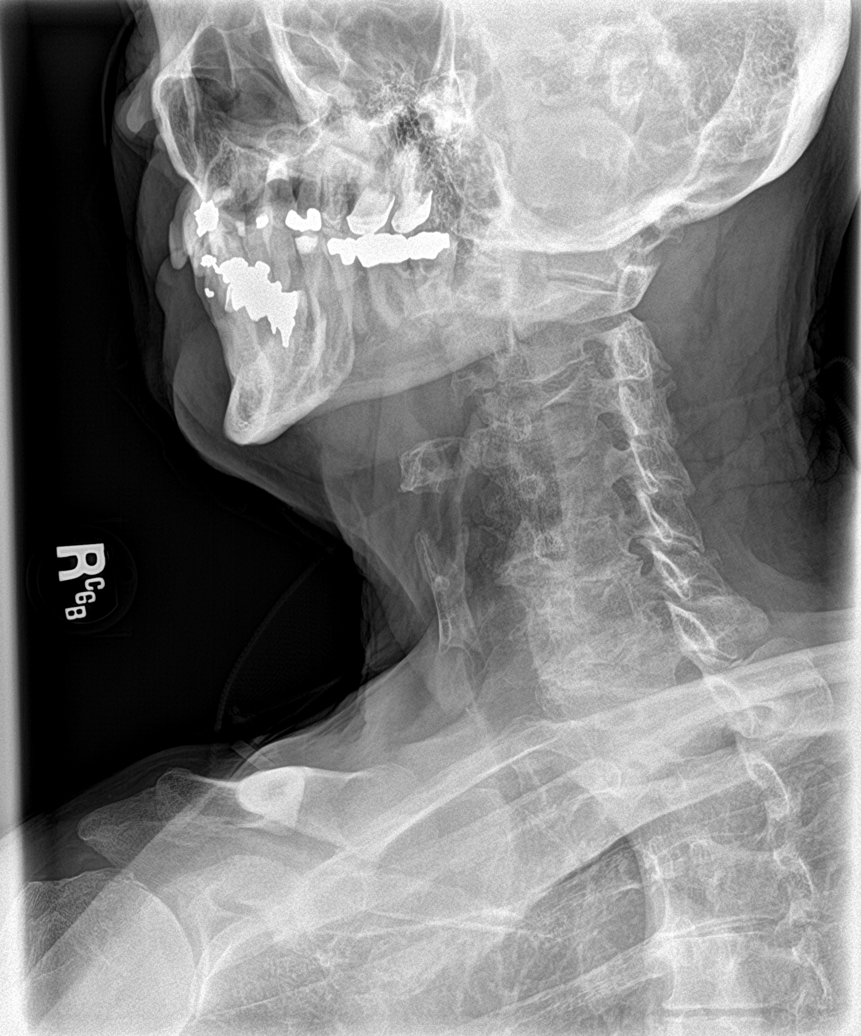
[im 4/6]
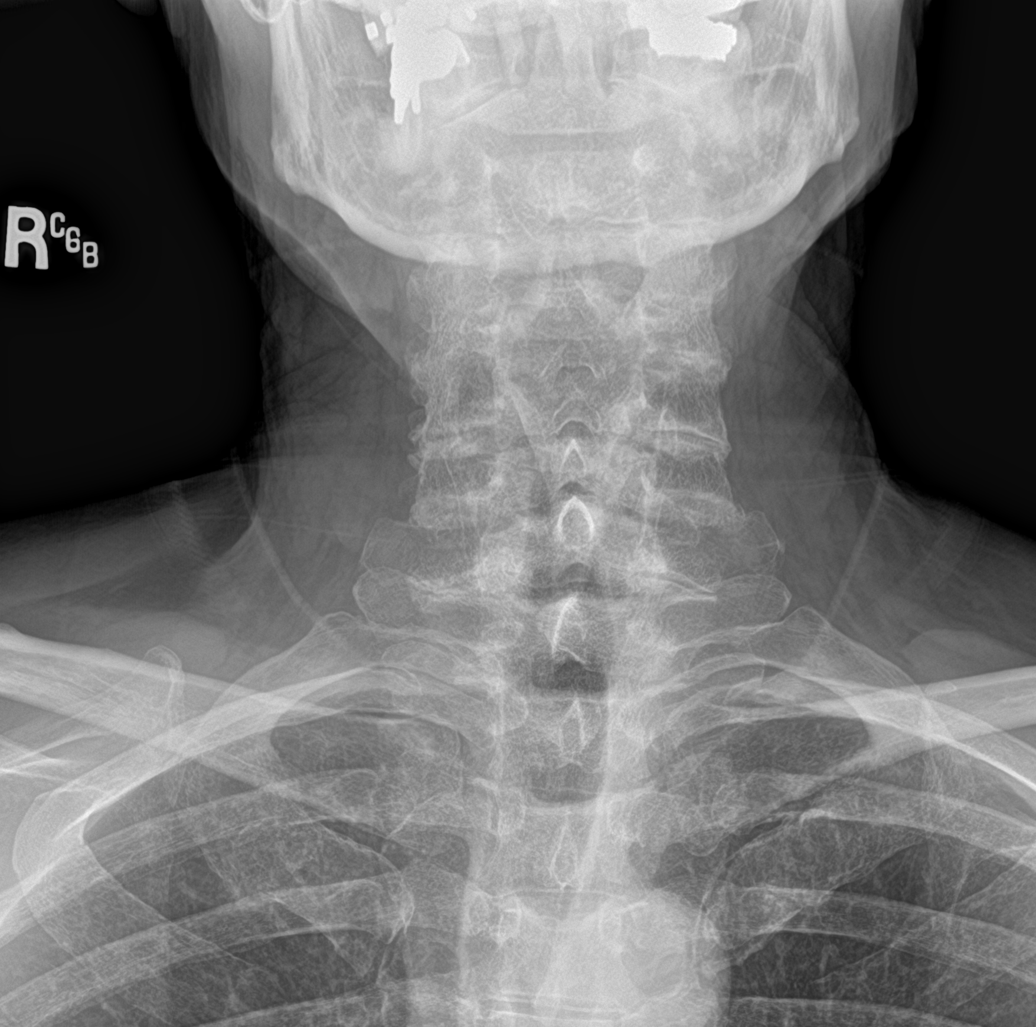
[im 5/6]
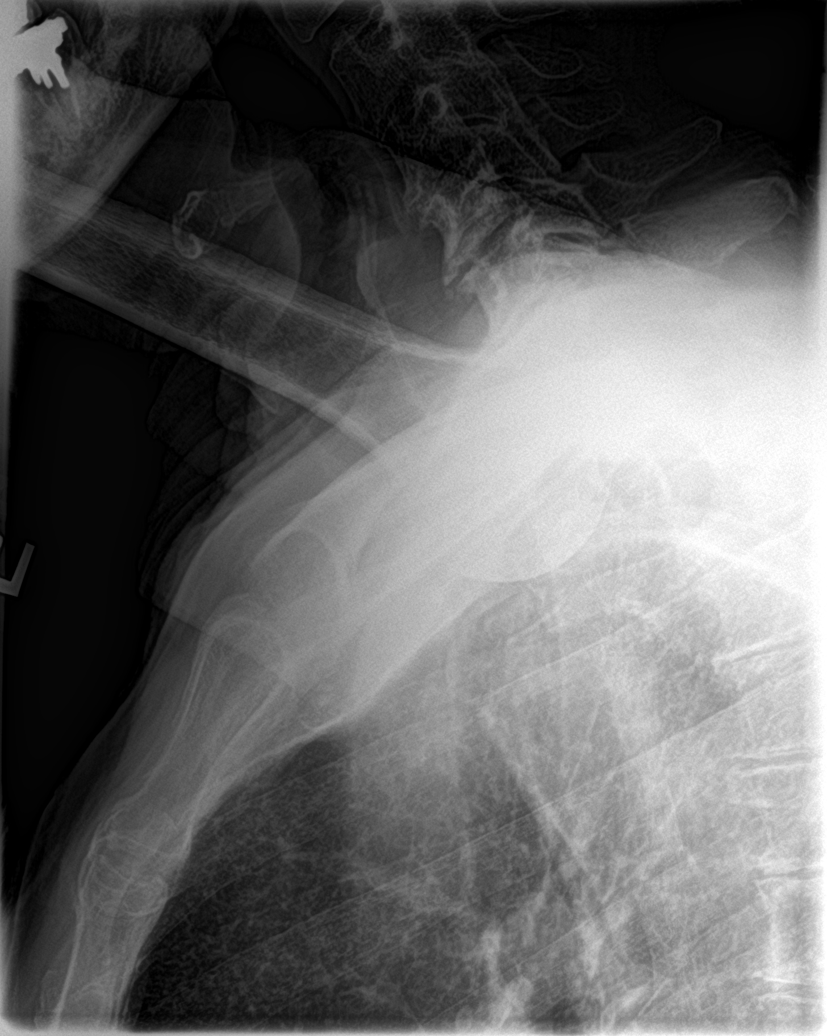
[im 6/6]
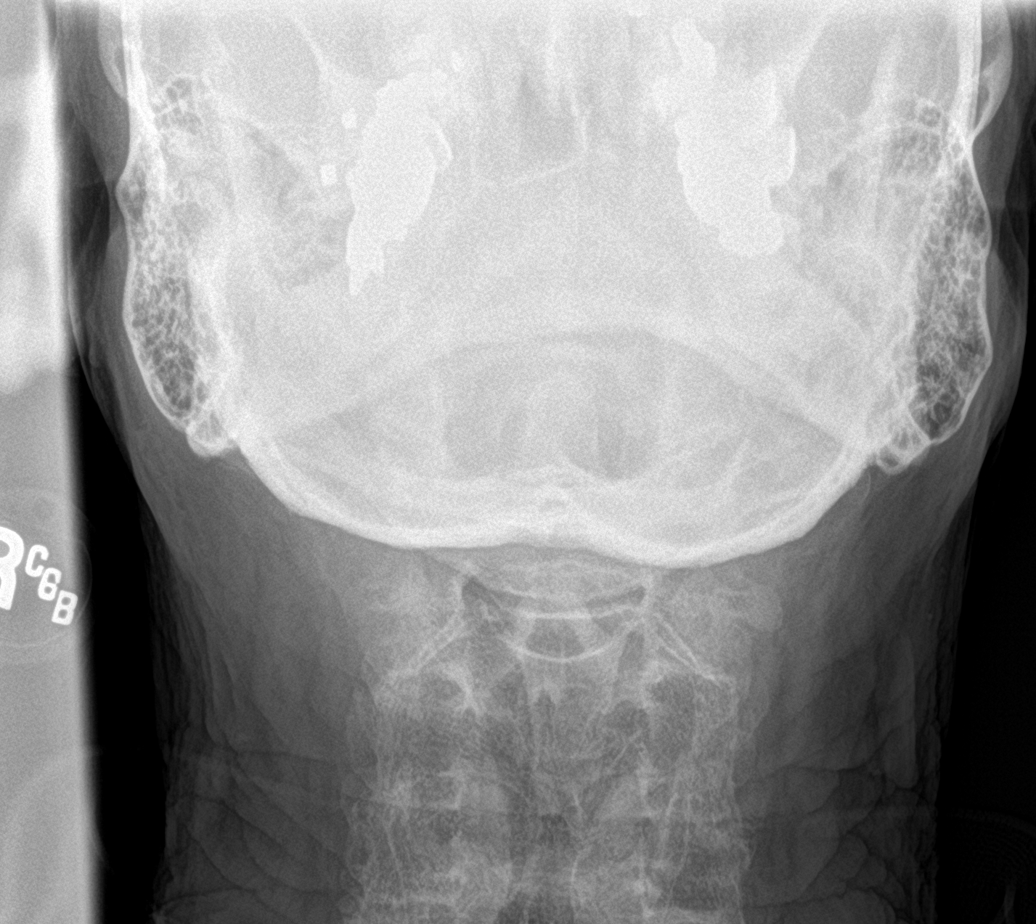

[6 of 6 positions shown; findings below may reference images not displayed]

FINDINGS: Straightening of the normal cervical lordosis. Disc space narrowing
and anterior posterior spur formation at the C3-4 through C6-7
levels. There are also mild facet degenerative changes at multiple
levels. Facet hypertrophy and uncinate spur formation are causing
mild foraminal stenosis on the left at the C4-5 level and moderate
foraminal stenosis on the left at the C5-6 and C6-7 levels. There is
mild foraminal stenosis on the right at the C3-4 and C4-5 levels,
moderate foraminal stenosis on the right at the C5-6 level and
marked foraminal stenosis on the right at the C6-7 level, not well
visualized. No fractures or subluxations are seen.
IMPRESSION: Degenerative changes, as described above.

## 2019-11-23 IMAGING — CR DG CHEST 2V
1 series · 2 of 2 positions shown · non-contrast
Comparison: None.

CLINICAL DATA: Confusion.

EXAM:
CHEST - 2 VIEW

[Series 1: dg chest 2 view · 0.14mm/px · 2 of 2 slices shown]
[im 1/2]
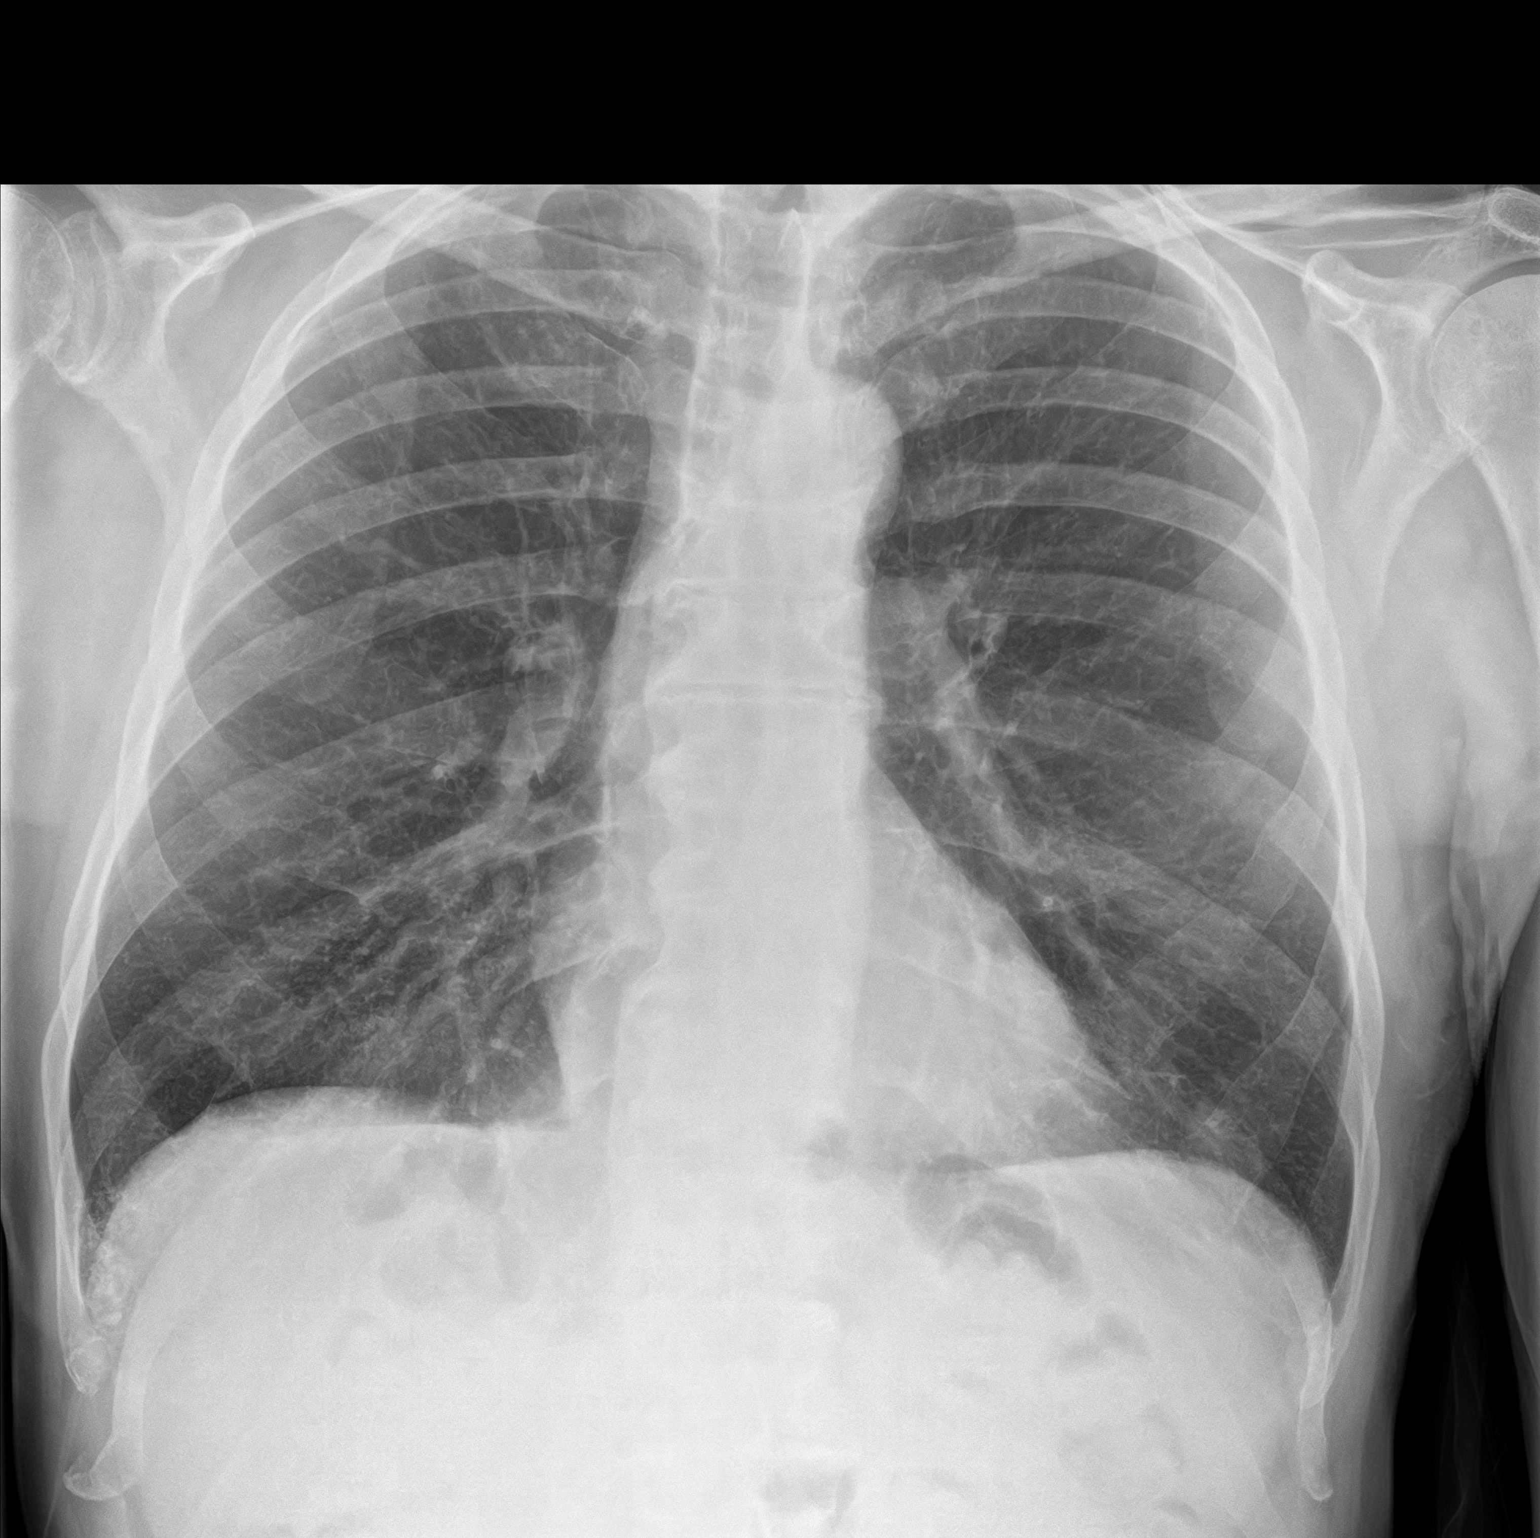
[im 2/2]
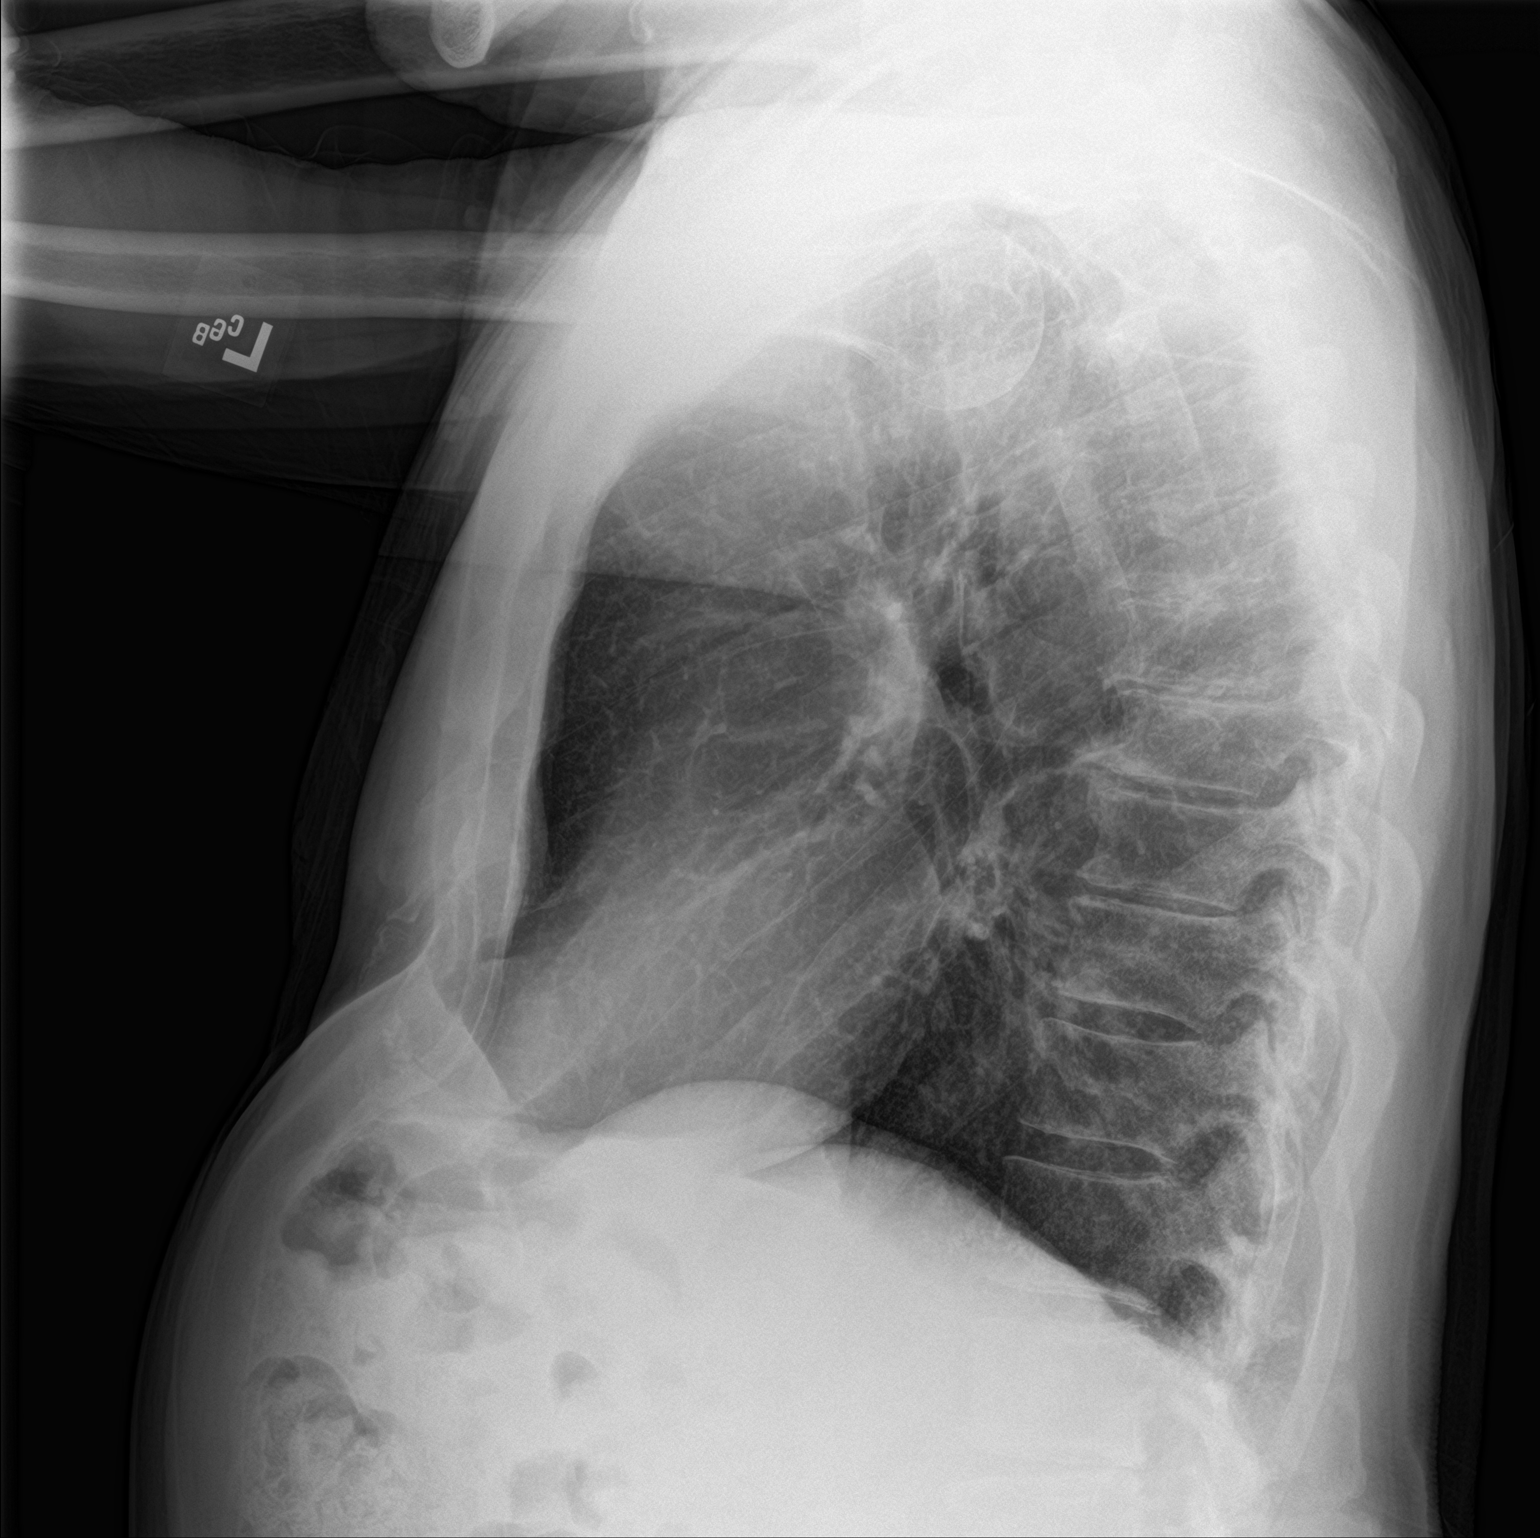

[2 of 2 positions shown; findings below may reference images not displayed]

FINDINGS: Cardiomediastinal silhouette is normal. Mediastinal contours appear
intact.

There is no evidence of focal airspace consolidation, pleural
effusion or pneumothorax.

Osseous structures are without acute abnormality. Soft tissues are
grossly normal.
IMPRESSION: No active cardiopulmonary disease.

## 2019-11-23 MED ORDER — PREDNISONE 20 MG PO TABS: 60.0000 mg | ORAL_TABLET | Freq: Once | ORAL | Status: DC

## 2019-11-23 MED ORDER — PREDNISONE 10 MG PO TABS: ORAL_TABLET | ORAL | 0 refills | Status: AC

## 2019-11-23 NOTE — ED Notes (Signed)
Pt ambulatory from lobby with steady gait. Pt reports neck pain that will not go away. Pt seen here recently for the same.

## 2019-11-23 NOTE — ED Triage Notes (Signed)
Pt to ED via POV c/o right sided neck pain x 5 days. Pt states that he came yesterday but left before he got seen. Pt is in NAD.

## 2019-11-23 NOTE — ED Notes (Signed)
Pt refuses discharge vitals

## 2019-11-23 NOTE — ED Provider Notes (Signed)
[facility] Emergency Department Provider Note ____________________________________________  Time seen: 1100  I have reviewed the triage vital signs and the nursing notes.  HISTORY  Chief Complaint  Neck Pain   HPI Timothy Robbins is a 86 y.o. male presents to the ER today with complaint of right-sided neck pain.  He reports this started 5 days ago.  He reports the neck pain initially started on the left side but that has resolved and he is now having pain on his right side.  He describes the pain as a sore and achy.  The pain is worse with movement.  The pain does not radiate.  He denies headache, dizziness, visual changes, numbness, tingling or weakness of his right upper extremity.  He denies any injury to the area or recent fall.  He reports he came to the ER yesterday but left without being seen.  MRI brain, MRA brain, MRA neck negative for any acute findings.  He had a head CT 7/15 at urgent care that was negative for any acute findings.  He has not taken any medications for this PTA.  He reports he had a similar episode 4 years ago, seen in this ER for the same.  Discussed with wife due to concerns for confusion. She reports he is confused all the time. She has tried to have him see his PCP for the same, but he reports he has not seen his PCP in 2 years, and every time she mentions him getting his memory checked, he gets angry. She does feel like he may have some dementia but there is no diagnosis for this.  Past Medical History:  Diagnosis Date  . Elevated PSA     Patient Active Problem List   Diagnosis Date Noted  . Elevated PSA     Past Surgical History:  Procedure Laterality Date  . APPENDECTOMY  1955  . BACK SURGERY  1980  . HERNIA REPAIR Left 2008    Prior to Admission medications   Medication Sig Start Date End Date Taking? Authorizing Provider  Desmopressin Acetate (NOCDURNA) 55.3 MCG SUBL Place 1 tablet under the tongue at bedtime. Take 1  hour prior to bedtime 12/16/17   Stoioff, Verna Czech, MD  naproxen (NAPROSYN) 375 MG tablet Take 1 tablet (375 mg total) by mouth 2 (two) times daily. 11/21/19   Moshe Cipro, NP  predniSONE (DELTASONE) 10 MG tablet Take 6 tabs day 1, 5 tabs day 2, 4 tabs day 3, 3 tabs day 4, 2 tabs day 5, 1 tab day 6 11/23/19   Lorre Munroe, NP    Allergies Patient has no known allergies.  Family History  Problem Relation Age of Onset  . Bladder Cancer Neg Hx   . Kidney cancer Neg Hx   . Prostate cancer Neg Hx   . Cancer Neg Hx     Social History Social History   Tobacco Use  . Smoking status: Never Smoker  . Smokeless tobacco: Never Used  Vaping Use  . Vaping Use: Never used  Substance Use Topics  . Alcohol use: Yes    Comment: Rarely  . Drug use: No    Review of Systems  Constitutional: Negative for fever, chills or body aches. Eyes: Negative for visual changes. ENT: Negative for sore throat. Cardiovascular: Negative for chest pain or chest tightness. Respiratory: Negative for cough or shortness of breath. Gastrointestinal: Negative for abdominal pain, nausea.. Genitourinary: Negative for urgency, frequency or dysuria. Musculoskeletal: Positive for right-sided neck  pain.  Negative for back pain. Skin: Negative for rash. Neurological:  Negative for headaches, focal weakness, tingling or numbness. ____________________________________________  PHYSICAL EXAM:  VITAL SIGNS: ED Triage Vitals  Enc Vitals Group     BP 11/23/19 1020 140/77     Pulse Rate 11/23/19 1018 (!) 54     Resp 11/23/19 1018 16     Temp 11/23/19 1018 97.9 F (36.6 C)     Temp Source 11/23/19 1018 Oral     SpO2 11/23/19 1018 98 %     Weight 11/23/19 1017 180 lb (81.6 kg)     Height 11/23/19 1017 6\' 1"  (1.854 m)     Head Circumference --      Peak Flow --      Pain Score 11/23/19 1017 5     Pain Loc --      Pain Edu? --      Excl. in GC? --     Constitutional: Alert and oriented. Well appearing and  in no distress. Head: Normocephalic and atraumatic. Eyes: Conjunctivae are normal. PERRL. Normal extraocular movements Mouth/Throat: Mucous membranes are moist.  No posterior pharynx erythema or exudate noted. Hematological/Lymphatic/Immunological: No cervical lymphadenopathy. Cardiovascular: Normal rate, regular rhythm.  Radial pulses 2+ bilaterally. Respiratory: Normal respiratory effort. No wheezes/rales/rhonchi. Musculoskeletal: Decreased extension and rotation secondary to pain.  No bony tenderness noted over the spine.  No pain with palpation of the right paracervical muscles.  Shoulder shrugs equal.  Strength 5/5 BUE.  Handgrips equal. Neurologic:  Normal gait without ataxia.  He is oriented to person and place.  Confused-he does not remember having any imaging here yesterday. Skin:  Skin is warm, dry and intact. No rash noted.  ____________________________________________   LABS  Labs Reviewed  URINALYSIS, ROUTINE W REFLEX MICROSCOPIC - Abnormal; Notable for the following components:      Result Value   Color, Urine YELLOW (*)    APPearance HAZY (*)    Protein, ur 30 (*)    All other components within normal limits  COMPREHENSIVE METABOLIC PANEL - Abnormal; Notable for the following components:   BUN 27 (*)    Creatinine, Ser 1.38 (*)    GFR calc non Af Amer 47 (*)    GFR calc Af Amer 54 (*)    All other components within normal limits  CBC    ____________________________________________  RADIOLOGY  Imaging Orders     DG Cervical Spine Complete     DG Chest 2 View IMPRESSION: No active cardiopulmonary disease.   IMPRESSION: Straightening of the normal cervical lordosis. Disc space narrowing and anterior posterior spur formation at the C3-4 through C6-7 levels. There are also mild facet degenerative changes at multiple levels. Facet hypertrophy and uncinate spur formation are causing mild foraminal stenosis on the left at the C4-5 level and moderate foraminal stenosis on  the left at the C5-6 and C6-7 levels. There is mild foraminal stenosis on the right at the C3-4 and C4-5 levels, moderate foraminal stenosis on the right at the C5-6 level and marked foraminal stenosis on the right at the C6-7 level, not well visualized. No fractures or subluxations are seen.   ____________________________________________    INITIAL IMPRESSION / ASSESSMENT AND PLAN / ED COURSE  Acute Neck Pain, Confusion:  MRI brain, MRA brain/neck, CT head reviewed Urinalysis negative CBC and CMET- slightly elevated creatinine, decreased GFR, encouraged  fluids Xray cervical spine c/w degenerative changes Chest xray negative Prednisone 60 mg PO x 1 RX for Pred Taper  x 6 days Will hold off on muscle relaxers secondary to confusion  ____________________________________________  FINAL CLINICAL IMPRESSION(S) / ED DIAGNOSES  Final diagnoses:  Acute neck pain  Confusion   Nicki Reaper, NP    Lorre Munroe, NP 11/23/19 1248    Concha Se, MD 11/23/19 1314

## 2019-11-23 NOTE — ED Notes (Signed)
Pt ambulating in hall. Pt states, "Just tired of waiting, my doctor must have went to lunch, he said he was going to be back 30 mins ago and he hasnt been back yet". Advised pt they would be back with him shortly.

## 2019-11-23 NOTE — Discharge Instructions (Addendum)
You were seen today for acute neck pain.  Your x-ray is consistent with arthritis.  We gave you a dose of prednisone and put you on prednisone for the next 6 days.  Please avoid NSAIDs OTC such as ibuprofen, Aleve, Motrin while on pPednisone.  Please follow-up with Dr. Burnett Sheng in 2 days for further evaluation of your neck pain.  Your kidney function was slightly decreased, please consume at least 48 ounces of water daily.

## 2019-11-23 NOTE — ED Notes (Signed)
Pt states, "I cant leave now can I?" Advised patient that we cannot force him to stay but this RN recommends waiting on xray results and lab results.

## 2020-03-24 ENCOUNTER — Emergency Department
Admission: EM | Admit: 2020-03-24 | Discharge: 2020-03-24 | Disposition: A | Discharge status: Home / Self Care | Payer: Medicare PPO | Attending: Emergency Medicine | Admitting: Emergency Medicine

## 2020-03-24 ENCOUNTER — Other Ambulatory Visit: Payer: Self-pay

## 2020-03-24 ENCOUNTER — Encounter: Payer: Self-pay | Admitting: Emergency Medicine

## 2020-03-24 ENCOUNTER — Emergency Department: Payer: Medicare PPO

## 2020-03-24 DIAGNOSIS — M898X8 Other specified disorders of bone, other site: Secondary | ICD-10-CM

## 2020-03-24 DIAGNOSIS — M7621 Iliac crest spur, right hip: Secondary | ICD-10-CM | POA: Insufficient documentation

## 2020-03-24 DIAGNOSIS — M25551 Pain in right hip: Secondary | ICD-10-CM | POA: Diagnosis present

## 2020-03-24 IMAGING — CR DG HIP (WITH OR WITHOUT PELVIS) 2-3V*R*
1 series · 3 of 3 positions shown · non-contrast
Comparison: None

CLINICAL DATA: Right hip pain

EXAM:
DG HIP (WITH OR WITHOUT PELVIS) 2-3V RIGHT

[Series 1: dg hip unilat w or w/o pelvis 2-3 views  · non-contrast · 0.14mm/px · 3 of 3 slices shown]
[im 1/3]
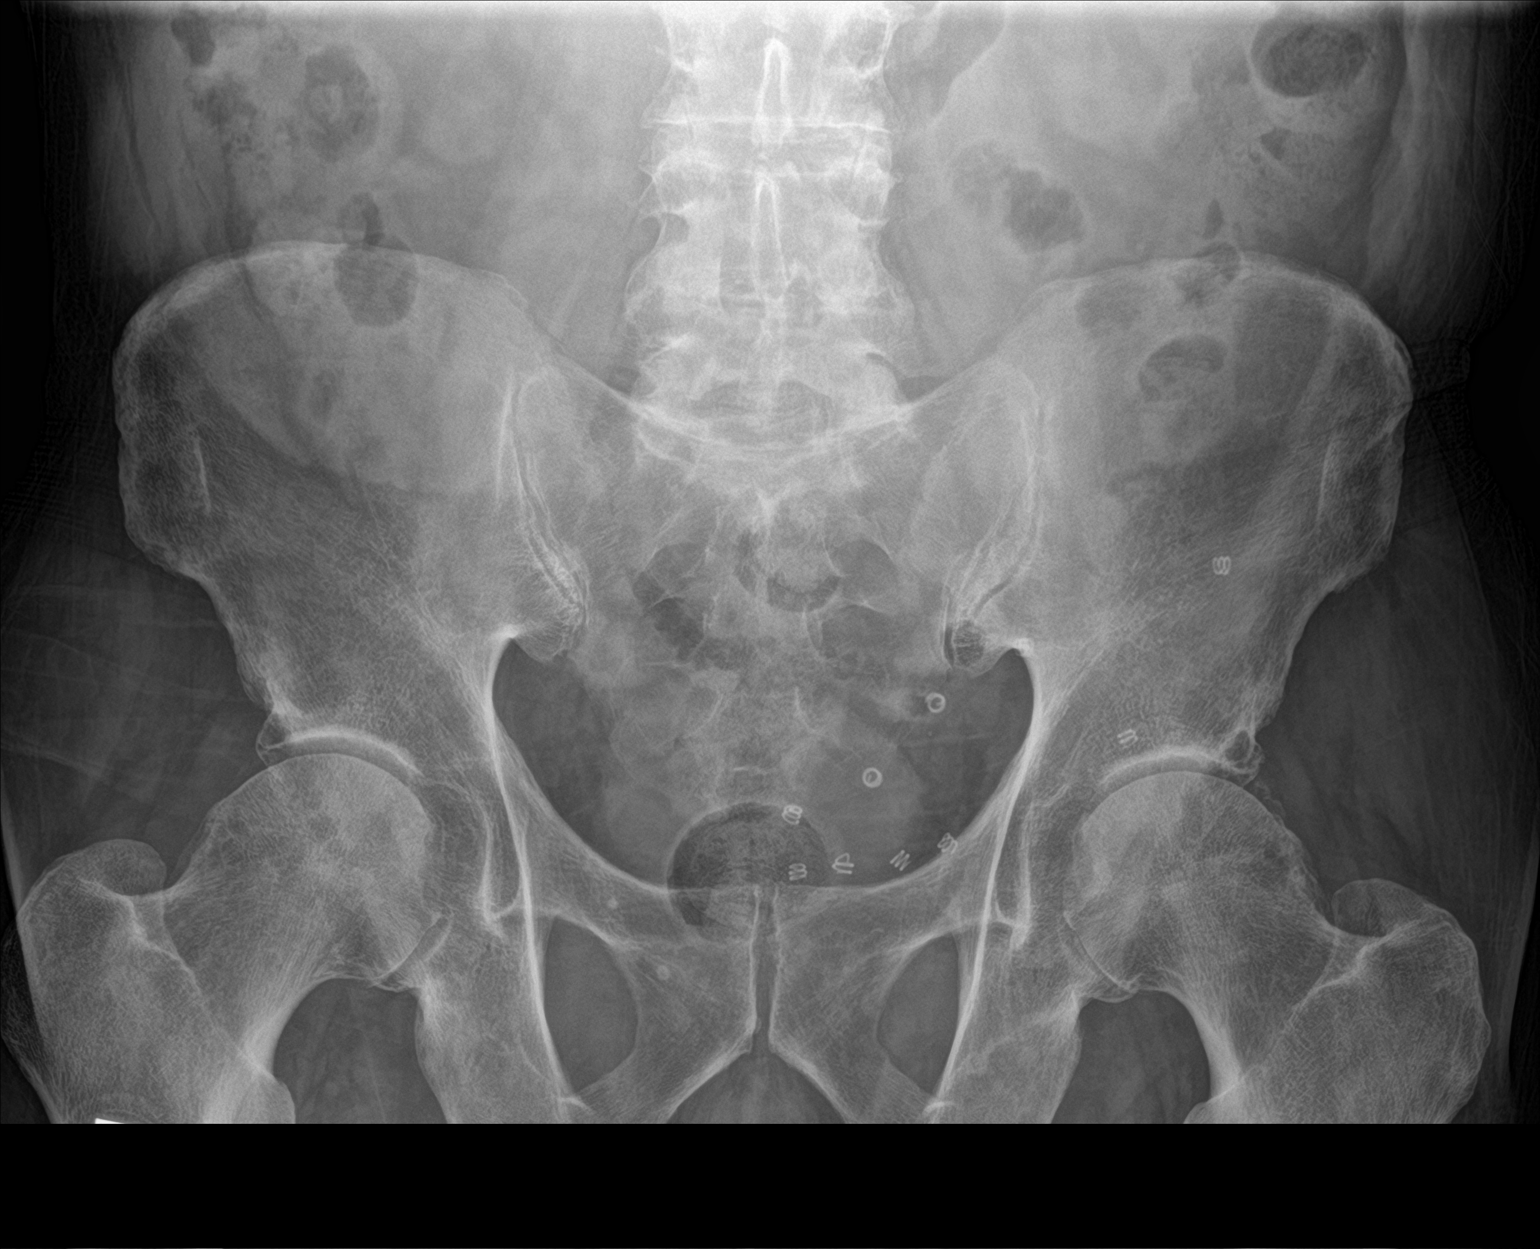
[im 2/3]
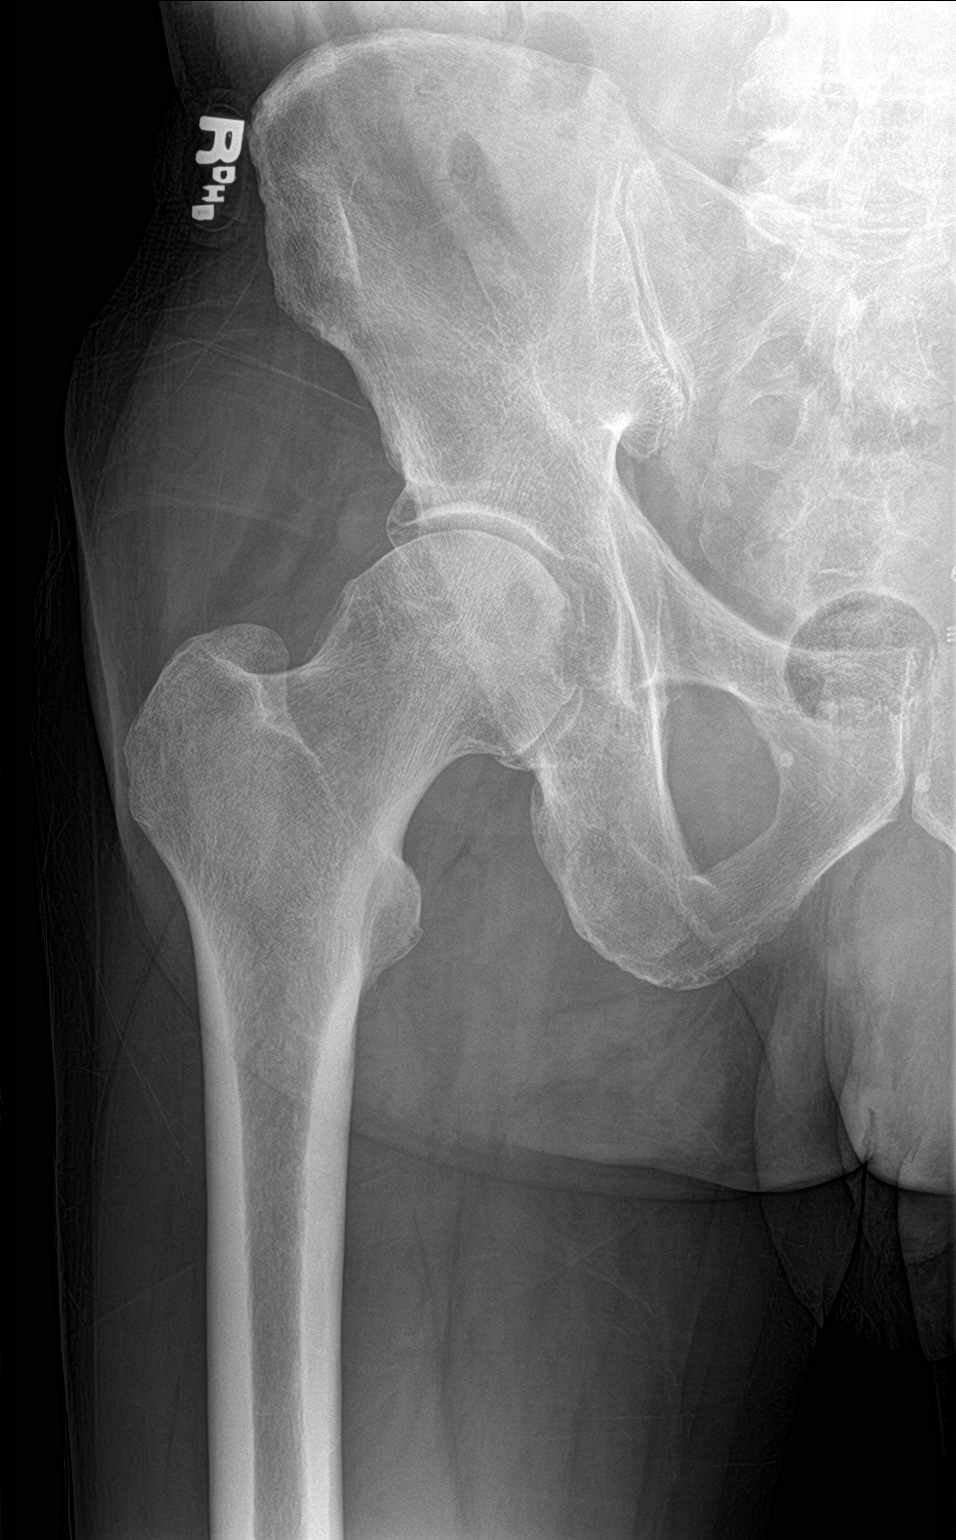
[im 3/3]
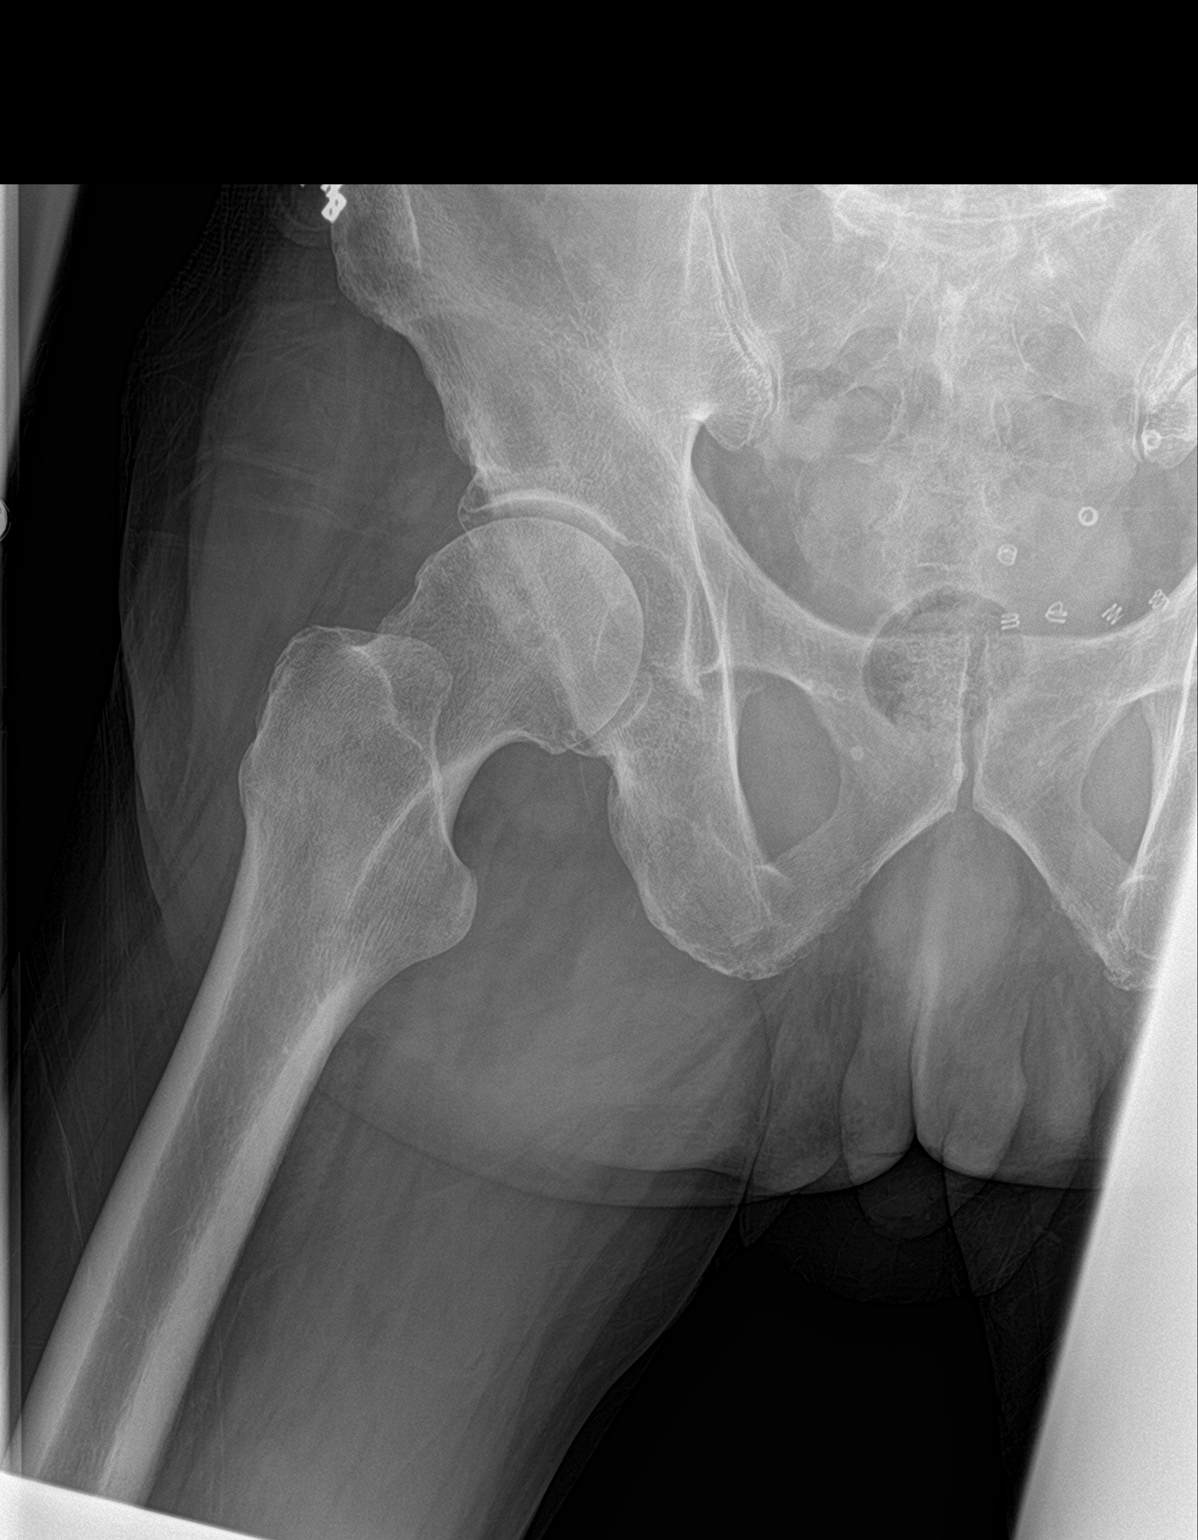

[3 of 3 positions shown; findings below may reference images not displayed]

FINDINGS: Single view radiograph pelvis and two view radiograph right hip
demonstrates normal alignment. No fracture or dislocation. Mild
bilateral degenerative hip arthritis is present left greater than
right. Left inguinal hernia repair with mesh has been performed.
IMPRESSION: No acute fracture or dislocation.

## 2020-03-24 MED ORDER — TRAMADOL HCL 50 MG PO TABS: 50.0000 mg | ORAL_TABLET | Freq: Four times a day (QID) | ORAL | 0 refills | Status: AC | PRN

## 2020-03-24 MED ORDER — ACETAMINOPHEN 500 MG PO TABS: 1000.0000 mg | ORAL_TABLET | Freq: Three times a day (TID) | ORAL | 0 refills | Status: AC | PRN

## 2020-03-24 MED ORDER — ACETAMINOPHEN 500 MG PO TABS
1000.0000 mg | ORAL_TABLET | Freq: Once | ORAL | Status: AC
  Administered 2020-03-24: 1000 mg via ORAL
  Filled 2020-03-24: qty 2

## 2020-03-24 MED ORDER — DICLOFENAC SODIUM 1 % EX GEL: 4.0000 g | Freq: Four times a day (QID) | CUTANEOUS | 1 refills | Status: AC

## 2020-03-24 NOTE — ED Triage Notes (Signed)
Patient ambulatory to triage with steady gait, without difficulty or distress noted; pt reports rt hip pain intermittently for last 1-72yrs; denies any specific injury

## 2020-03-24 NOTE — Discharge Instructions (Signed)
For pain take 1000 mg (2 extra strength) tylenol every 8 hours. Take 50mg  of tramadol every 4 hours for breakthrough pain. This medication may cause drowsiness therefore it is best to be taken at bedtime to help get your pain under control so you can sleep. Apply voltaren gel and massage it over the area of pain 4 times a day to help with the pain. Follow up with your doctor in 2-3 days if symptoms are not improving. Return to the ER for worsening pain, fever, abdominal pain, pain with urination.

## 2020-03-24 NOTE — ED Provider Notes (Signed)
The pain  [facility] Emergency Department Provider Note  ____________________________________________  Time seen: Approximately 5:19 AM  I have reviewed the triage vital signs and the nursing notes.   HISTORY  Chief Complaint Hip Pain   HPI Timothy Robbins is a 86 y.o. male no significant past medical history who presents for evaluation of right hip pain.  Patient reports that the pain has been there for 2 to 3 weeks.  Is not getting worse or better.  He has been having difficulty sleeping at night because of the pain.  He denies any trauma or inciting event that happened 3 weeks ago when the pain started.   He denies any injections in his hip or any hip surgery.  He denies any trauma.  He reports that the pain is better when he sits down but he gets worse with ambulation and at night when he is trying to sleep.  The pain is located the posterior aspect of the right hip just below the iliac crest.  No abdominal pain, no back pain, the pain does not radiate down his leg, no weakness of his legs or numbness, no dysuria or hematuria, no fever, no saddle anesthesia, no scrotal pain or swelling, no flank pain.  He has taken aspirin for the pain at home with no significant relief.  Patient reports a similar episode a few years ago which resolved on its own.  Past Medical History:  Diagnosis Date  . Elevated PSA     Patient Active Problem List   Diagnosis Date Noted  . Elevated PSA     Past Surgical History:  Procedure Laterality Date  . APPENDECTOMY  1955  . BACK SURGERY  1980  . HERNIA REPAIR Left 2008    Prior to Admission medications   Medication Sig Start Date End Date Taking? Authorizing Provider  acetaminophen (TYLENOL) 500 MG tablet Take 2 tablets (1,000 mg total) by mouth every 8 (eight) hours as needed for mild pain or moderate pain. 03/24/20 03/24/21  Nita Sickle, MD  Desmopressin Acetate (NOCDURNA) 55.3 MCG SUBL Place 1 tablet under the  tongue at bedtime. Take 1 hour prior to bedtime 12/16/17   Stoioff, Verna Czech, MD  diclofenac Sodium (VOLTAREN) 1 % GEL Apply 4 g topically 4 (four) times daily. 03/24/20   Nita Sickle, MD  predniSONE (DELTASONE) 10 MG tablet Take 6 tabs day 1, 5 tabs day 2, 4 tabs day 3, 3 tabs day 4, 2 tabs day 5, 1 tab day 6 11/23/19   Baity, Salvadore Oxford, NP  traMADol (ULTRAM) 50 MG tablet Take 1 tablet (50 mg total) by mouth every 6 (six) hours as needed. 03/24/20 03/24/21  Nita Sickle, MD    Allergies Patient has no known allergies.  Family History  Problem Relation Age of Onset  . Bladder Cancer Neg Hx   . Kidney cancer Neg Hx   . Prostate cancer Neg Hx   . Cancer Neg Hx     Social History Social History   Tobacco Use  . Smoking status: Never Smoker  . Smokeless tobacco: Never Used  Vaping Use  . Vaping Use: Never used  Substance Use Topics  . Alcohol use: Yes    Comment: Rarely  . Drug use: No    Review of Systems  Constitutional: Negative for fever. Eyes: Negative for visual changes. ENT: Negative for sore throat. Neck: No neck pain  Cardiovascular: Negative for chest pain. Respiratory: Negative for shortness of breath. Gastrointestinal: Negative for  abdominal pain, vomiting or diarrhea. Genitourinary: Negative for dysuria. Musculoskeletal: Negative for back pain. + R hip pain Skin: Negative for rash. Neurological: Negative for headaches, weakness or numbness. Psych: No SI or HI  ____________________________________________   PHYSICAL EXAM:  VITAL SIGNS: ED Triage Vitals  Enc Vitals Group     BP 03/24/20 0341 (!) 152/80     Pulse Rate 03/24/20 0341 68     Resp 03/24/20 0341 18     Temp 03/24/20 0341 97.9 F (36.6 C)     Temp Source 03/24/20 0341 Oral     SpO2 03/24/20 0341 95 %     Weight 03/24/20 0342 170 lb (77.1 kg)     Height 03/24/20 0342 6\' 1"  (1.854 m)     Head Circumference --      Peak Flow --      Pain Score 03/24/20 0341 7     Pain Loc --       Pain Edu? --      Excl. in GC? --     Constitutional: Alert and oriented. Well appearing and in no apparent distress. HEENT:      Head: Normocephalic and atraumatic.         Eyes: Conjunctivae are normal. Sclera is non-icteric.       Mouth/Throat: Mucous membranes are moist.       Neck: Supple with no signs of meningismus. Cardiovascular: Regular rate and rhythm. No murmurs, gallops, or rubs. 2+ symmetrical distal pulses are present in all extremities.  Respiratory: Normal respiratory effort. Lungs are clear to auscultation bilaterally.  Gastrointestinal: Soft, non tender, and non distended with positive bowel sounds. No rebound or guarding. Genitourinary: No CVA tenderness. No testicular tenderness or swelling, no inguinal hernia appreciated. Musculoskeletal: No tenderness with ROM of the hip, no tenderness to palpation, no swelling or erythema. Patient is tender in the posterior aspect of the pelvis below the iliac crest.  No midline CT and L-spine tenderness Neurologic: Normal speech and language. Face is symmetric.  Intact strength and sensation x4. Skin: Skin is warm, dry and intact. No rash noted. Psychiatric: Mood and affect are normal. Speech and behavior are normal.  ____________________________________________   LABS (all labs ordered are listed, but only abnormal results are displayed)  Labs Reviewed - No data to display ____________________________________________  EKG  none  ____________________________________________  RADIOLOGY  I have personally reviewed the images performed during this visit and I agree with the Radiologist's read.   Interpretation by Radiologist:  DG Hip Unilat W or Wo Pelvis 2-3 Views Right  Result Date: 03/24/2020 CLINICAL DATA:  Right hip pain EXAM: DG HIP (WITH OR WITHOUT PELVIS) 2-3V RIGHT COMPARISON:  None FINDINGS: Single view radiograph pelvis and two view radiograph right hip demonstrates normal alignment. No fracture or  dislocation. Mild bilateral degenerative hip arthritis is present left greater than right. Left inguinal hernia repair with mesh has been performed. IMPRESSION: No acute fracture or dislocation. Electronically Signed   By: 03/26/2020 MD   On: 03/24/2020 04:30     ____________________________________________   PROCEDURES  Procedure(s) performed: None Procedures Critical Care performed:  None ____________________________________________   INITIAL IMPRESSION / ASSESSMENT AND PLAN / ED COURSE  86 y.o. male no significant past medical history who presents for evaluation of right hip pain.  Patient reports 1 prior episode of similar pain several years ago.  Review of old medical records shows the patient was in the emergency room in 2018 for the same complaint with  normal work-up.  Patient has full painless range of motion of his hip with no swelling or erythema therefore no signs of a septic joint.  No systemic symptoms or fever, no injection in the joint or prior surgery.  No trauma.  Patient is actually tender to palpation at the same location he was in 2018 on the posterior right pelvis region just below the right iliac crest.  There is no rash.  Neurologically intact otherwise.  His abdomen is completely soft with no tenderness.  X-ray visualized by me with mild arthritis but no pathological fractures, no bony lesions, no dislocation.  Will prescribe Tylenol 1000 mg every 8 hours, tramadol as needed at bedtime to help him sleep, and Voltaren gel 4 times a day.  Recommended follow-up with PCP for possible steroid injections if symptoms are not improving.  Discussed my standard return precautions which include abdominal pain, fever, scrotal pain or swelling, dysuria, back pain, difficulty walking, swelling of the hip joint, saddle anesthesia, or lower extremity weakness or numbness.      _____________________________________________ Please note:  Patient was evaluated in Emergency Department  today for the symptoms described in the history of present illness. Patient was evaluated in the context of the global COVID-19 pandemic, which necessitated consideration that the patient might be at risk for infection with the SARS-CoV-2 virus that causes COVID-19. Institutional protocols and algorithms that pertain to the evaluation of patients at risk for COVID-19 are in a state of rapid change based on information released by regulatory bodies including the CDC and federal and state organizations. These policies and algorithms were followed during the patient's care in the ED.  Some ED evaluations and interventions may be delayed as a result of limited staffing during the pandemic.   [place] Controlled Substance Database was reviewed by me. ____________________________________________   FINAL CLINICAL IMPRESSION(S) / ED DIAGNOSES   Final diagnoses:  Iliac crest bone pain      NEW MEDICATIONS STARTED DURING THIS VISIT:  ED Discharge Orders         Ordered    traMADol (ULTRAM) 50 MG tablet  Every 6 hours PRN        03/24/20 0517    acetaminophen (TYLENOL) 500 MG tablet  Every 8 hours PRN        03/24/20 0517    diclofenac Sodium (VOLTAREN) 1 % GEL  4 times daily        03/24/20 8921           Note:  This document was prepared using Dragon voice recognition software and may include unintentional dictation errors.    Nita Sickle, MD 03/24/20 [phone]

## 2021-01-12 ENCOUNTER — Other Ambulatory Visit: Payer: Self-pay

## 2021-01-12 ENCOUNTER — Emergency Department: Payer: Medicare PPO

## 2021-01-12 ENCOUNTER — Emergency Department
Admission: EM | Admit: 2021-01-12 | Discharge: 2021-01-13 | Disposition: A | Discharge status: Other Acute Inpatient Hospital | Payer: Medicare PPO | Attending: Emergency Medicine | Admitting: Emergency Medicine

## 2021-01-12 DIAGNOSIS — R2689 Other abnormalities of gait and mobility: Secondary | ICD-10-CM | POA: Diagnosis not present

## 2021-01-12 DIAGNOSIS — Z20822 Contact with and (suspected) exposure to covid-19: Secondary | ICD-10-CM | POA: Diagnosis not present

## 2021-01-12 DIAGNOSIS — Z046 Encounter for general psychiatric examination, requested by authority: Secondary | ICD-10-CM | POA: Diagnosis not present

## 2021-01-12 DIAGNOSIS — R4182 Altered mental status, unspecified: Secondary | ICD-10-CM | POA: Diagnosis present

## 2021-01-12 DIAGNOSIS — Y900 Blood alcohol level of less than 20 mg/100 ml: Secondary | ICD-10-CM | POA: Diagnosis not present

## 2021-01-12 DIAGNOSIS — F0391 Unspecified dementia with behavioral disturbance: Secondary | ICD-10-CM | POA: Diagnosis not present

## 2021-01-12 DIAGNOSIS — R410 Disorientation, unspecified: Secondary | ICD-10-CM

## 2021-01-12 LAB — CBC
HCT: 40.3 % (ref 39.0–52.0)
Hemoglobin: 13.8 g/dL (ref 13.0–17.0)
MCH: 31.5 pg (ref 26.0–34.0)
MCHC: 34.2 g/dL (ref 30.0–36.0)
MCV: 92 fL (ref 80.0–100.0)
Platelets: 198 10*3/uL (ref 150–400)
RBC: 4.38 MIL/uL (ref 4.22–5.81)
RDW: 12.7 % (ref 11.5–15.5)
WBC: 7.7 10*3/uL (ref 4.0–10.5)
nRBC: 0 % (ref 0.0–0.2)

## 2021-01-12 LAB — URINALYSIS, COMPLETE (UACMP) WITH MICROSCOPIC
Bacteria, UA: NONE SEEN
Glucose, UA: NEGATIVE mg/dL
Hgb urine dipstick: NEGATIVE
Ketones, ur: NEGATIVE mg/dL
Leukocytes,Ua: NEGATIVE
Nitrite: NEGATIVE
Protein, ur: 30 mg/dL — AB
Specific Gravity, Urine: 1.03 (ref 1.005–1.030)
Squamous Epithelial / HPF: NONE SEEN (ref 0–5)
pH: 5 (ref 5.0–8.0)

## 2021-01-12 LAB — COMPREHENSIVE METABOLIC PANEL
ALT: 19 U/L (ref 0–44)
AST: 34 U/L (ref 15–41)
Albumin: 4.1 g/dL (ref 3.5–5.0)
Alkaline Phosphatase: 79 U/L (ref 38–126)
Anion gap: 9 (ref 5–15)
BUN: 28 mg/dL — ABNORMAL HIGH (ref 8–23)
CO2: 26 mmol/L (ref 22–32)
Calcium: 9.7 mg/dL (ref 8.9–10.3)
Chloride: 103 mmol/L (ref 98–111)
Creatinine, Ser: 1.24 mg/dL (ref 0.61–1.24)
GFR, Estimated: 57 mL/min — ABNORMAL LOW (ref >60)
Glucose, Bld: 105 mg/dL — ABNORMAL HIGH (ref 70–99)
Potassium: 4.3 mmol/L (ref 3.5–5.1)
Sodium: 138 mmol/L (ref 135–145)
Total Bilirubin: 1.1 mg/dL (ref 0.3–1.2)
Total Protein: 7.9 g/dL (ref 6.5–8.1)

## 2021-01-12 LAB — RESP PANEL BY RT-PCR (FLU A&B, COVID) ARPGX2
Influenza A by PCR: NEGATIVE
Influenza B by PCR: NEGATIVE
SARS Coronavirus 2 by RT PCR: NEGATIVE

## 2021-01-12 LAB — SALICYLATE LEVEL: Salicylate Lvl: <7 mg/dL — ABNORMAL LOW (ref 7.0–30.0)

## 2021-01-12 LAB — ACETAMINOPHEN LEVEL: Acetaminophen (Tylenol), Serum: <10 ug/mL — ABNORMAL LOW (ref 10–30)

## 2021-01-12 LAB — URINE DRUG SCREEN, QUALITATIVE (ARMC ONLY)
Amphetamines, Ur Screen: NOT DETECTED
Barbiturates, Ur Screen: NOT DETECTED
Benzodiazepine, Ur Scrn: NOT DETECTED
Cannabinoid 50 Ng, Ur ~~LOC~~: NOT DETECTED
Cocaine Metabolite,Ur ~~LOC~~: NOT DETECTED
MDMA (Ecstasy)Ur Screen: NOT DETECTED
Methadone Scn, Ur: NOT DETECTED
Opiate, Ur Screen: NOT DETECTED
Phencyclidine (PCP) Ur S: NOT DETECTED
Tricyclic, Ur Screen: NOT DETECTED

## 2021-01-12 LAB — ETHANOL: Alcohol, Ethyl (B): <10 mg/dL (ref <10)

## 2021-01-12 MED ORDER — HALOPERIDOL 5 MG PO TABS
5.0000 mg | ORAL_TABLET | Freq: Once | ORAL | Status: AC
  Administered 2021-01-12: 5 mg via ORAL
  Filled 2021-01-12 (×2): qty 1

## 2021-01-12 MED ORDER — LORAZEPAM 1 MG PO TABS
1.0000 mg | ORAL_TABLET | Freq: Once | ORAL | Status: AC
  Administered 2021-01-12: 1 mg via ORAL
  Filled 2021-01-12 (×2): qty 1

## 2021-01-12 MED ORDER — HALOPERIDOL LACTATE 5 MG/ML IJ SOLN
5.0000 mg | Freq: Once | INTRAMUSCULAR | Status: DC
  Filled 2021-01-12: qty 1

## 2021-01-12 NOTE — Consult Note (Signed)
  Psychiatry: Brief concurrence.  Case reviewed with nurse practitioner and TTS.  87 year old man without a past psychiatric diagnosis but who presents with confusion and dementia.  Currently not able to make reasonable decisions around his own self-care.  Uphold IVC and recommend referral to geriatric psychiatry units.  No other family that can take care of him has yet been identified.

## 2021-01-12 NOTE — ED Notes (Signed)
Ambulates safely to and from bathroom independently.

## 2021-01-12 NOTE — ED Provider Notes (Signed)
Patient is insisting his wife, who is in [facility] getting rehab, is coming to pick him up and take him home.  He is demented and confused.  We cannot talk him into being quiet for more than a few minutes.  We tried to put him in BHU to see if different environment will calm him down but now he is thinking he is a visitor and he cannot be kept locked up.  I will try and see if the nurse can give him some p.o. medication.  Otherwise he is so disruptive and trying to get out and confrontational and has even threatened violence here we may have to restrain him temporarily and give him some IV and medication.   Arnaldo Natal, MD 01/12/21 2159

## 2021-01-12 NOTE — ED Notes (Signed)
Report received from Ally, RN including SBAR. Patient alert and oriented, warm and dry, and in no acute distress. Patient denies SI, HI, AVH and pain. Patient made aware of Q15 minute rounds and Rover and Officer presence for their safety. Patient instructed to come to this nurse with needs or concerns.  

## 2021-01-12 NOTE — ED Notes (Signed)
Attempted to get in touch with wife per patient request. Home phone called, states not available number. Called cell phone of wife, no answer and VM full.

## 2021-01-12 NOTE — ED Triage Notes (Addendum)
Pt comes into the ED via [place] [employer] under IVC, reported that PD was called out to the resident 3 times this month, pt was found wondering the parking lot for hours looking for his car, pt locked himself out of his resident and did not know his wife is currently in a nursing home  Pt is calm and cooperative at present.  Pt is not oriented to time or date, pt keeps asking if we know if his wife is in the hospital..

## 2021-01-12 NOTE — ED Notes (Addendum)
Pt dressed out into hospital provided attire with this tech and two [employer] in the rm. Pt belongings consist of grey tennis shoes, a yellow shirt, green pants, a brown belt, and a brown wallet with 10 $20 bills, 1 $5 bill, and 4 $1 bills. Pt calm and cooperative while dressing out. Pt belongings placed into a pt belongings bag and labeled with pt name. Pt has one bag of belongings. Pt refused to have his money locked in the safe. Connye Burkitt, RN made aware. Pt stated, my wedding ring will not come off."

## 2021-01-12 NOTE — BH Assessment (Signed)
Adult/[facility]  Referral information for Psychiatric Hospitalization faxed to:   [facility] ([phone]---[phone]---[phone])   [facility] [phone] -or- [phone], [phone])   [facility] ([phone] or [phone]),  . [facility] ([phone] -or- [phone]), 

## 2021-01-12 NOTE — BH Assessment (Signed)
Adult/[facility]   Referral information for Psychiatric Hospitalization faxed to:    [facility] ([phone]---[phone]) No answer, Voicemail was left for return phone call    [facility] [phone] -or- [phone], [phone]) Tammy reports patient is currently under review as of 10:53pm 01/12/21    [facility] [phone] or [phone]), No answer   . [facility] [phone] -[phone]), Reports no current psychotic beds available at this time

## 2021-01-12 NOTE — ED Notes (Signed)
Pt states he does not know why he is here, "they won't tell me anything".

## 2021-01-12 NOTE — ED Notes (Signed)
Pt has now exited room x4 times since 7 in attempt to leave. Pt has become increasingly more agitated due to wanting to leave. Pt states he lives one block over and there is no reason for him to be here. Pt believes his wife is looking for him. Pt is increasing in verbal aggression toward staff due to pt not being allowed to leave unit. Dr. Tillman Abide notified of patient behaviors

## 2021-01-12 NOTE — BH Assessment (Signed)
PATIENT BED AVAILABLE AFTER 12PM FOR 01/13/21  Patient has been accepted to [facility].  Patient assigned to Geriatric Unit Accepting physician is Dr. Fabio Neighbors.  Call report to [phone].  Representative was Tammy.   ER Staff is aware of it:  [facility] ER Secretary  Dr. Dolores Frame, ER MD  [facility] Patient's Nurse     Attempted to contact patient's wife Timothy Robbins at [phone] and [phone] but no answer at either number  Address: [address] Timothy Robbins [place] [address]

## 2021-01-12 NOTE — ED Notes (Signed)
Pt has refused the IM injection or any other medication. Pt is persistent despite actions and communication by staff. This nurse, security, and Dr. Darnelle Catalan are in communication and feel that it is not appropriate for patient to receive force meds at this time. Will continue to monitor and attempt to deescalate patient when arises and administer IM should it escalate further.

## 2021-01-12 NOTE — ED Notes (Signed)
Offered food, declines.

## 2021-01-12 NOTE — ED Notes (Signed)
Pt Haldol IM injection that had been drawn up was discarded by this nurse.

## 2021-01-12 NOTE — ED Notes (Signed)
Pt resting in bed, watching tv. Pt is confused on where he is, educated on situation. Pt expresses understanding

## 2021-01-12 NOTE — ED Notes (Signed)
Taken to CT via wheelchair with security as escort and CT staff.

## 2021-01-12 NOTE — ED Notes (Signed)
EDP at bedside  

## 2021-01-12 NOTE — ED Notes (Signed)
IVC moved to [facility]   pending geri psych admit

## 2021-01-12 NOTE — Consult Note (Signed)
[facility] Face-to-Face Psychiatry Consult   Reason for Consult:  Consult  for 87 year old male brought in under IVC for confusion/AMS, possible dementia.  Referring Physician: Antoine Robbins Patient Identification: Timothy Robbins MRN:  [id number] Principal Diagnosis: Altered mental status Diagnosis:  Principal Problem:   Altered mental status   Total Time spent with patient: 1.5 hours  Subjective:   Timothy Robbins is a 87 y.o. male patient admitted with AMS, possible dementia. Patient states "I have no illness; I have no soreness."  HPI: Patient was seen and chart reviewed.  Patient is a 87 year old man with no known medical history of dementia or other medical problems.  This Clinical research associate contacted IVC petitioner, Timothy Robbins [phone]) of the mobile crisis unit to clarify information.  Timothy Robbins states police were called to the patient's home after his neighbors saw him trying to get into his house, knocking on the door for his wife, who is known to be in a nursing home at this time. Neighbors called police,  This is the third time he has locked himself out of the house recently.  Police called the mobile crisis unit to come and evaluate patient, and his 2 friends who came over to the house at that time to talk to police said that patient's memory has been declining over the past 6 to 9 months.  Patient refused to go to hospital for evaluation willingly, and instead got into his car and drove around. Timothy Robbins was able to obtain IVC and [facility] Police Department brought patient to the hospital.  Patient's friends also state that patient's wife broke her hip and is in a nursing home in [place].  On interview, patient is clearly agitated, walking around stating that he needs to go home.  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  He says that when he got home the police were there  and brought him here.  He states that he does not know why he is here and he needs to go home.  Patient states that his wife is in a rehab facility and has been there for about a week, but he does not know where she is (Wife is at [facility] after having hip replacement, [phone]).  Patient denies depression, suicidal ideation, homicidal ideation, auditory or visual hallucinations.  He is alert to self.  He states that the year is 2022, cannot recall the day or the month.  When asked who the president is he states "it is not Kyung Rudd or [name]."  He is aware that he is in [facility] at this time. Patient states that his wife is "in the hospital."  He cannot verbalize exactly why she is there or name of facility.  He then states "she is in the rest home."  Patient denies ever having any depression, stating "I am not the depressed type."  He denies any previous depression or suicide attempts.  He reports good appetite and good sleep.  Patient denies any tobacco, alcohol, or illicit drug use.  Patient states that he is healthy and does not take any medications.  Patient states that he and his wife have 2 daughters, one in [place] and one in [place].  He declines to give [place] permission to speak to them at this time.   Past Psychiatric History: Denies, none per chart review  Risk  to Self:   Risk to Others:   Prior Inpatient Therapy:   Prior Outpatient Therapy:    Past Medical History:  Past Medical History:  Diagnosis Date   Elevated PSA     Past Surgical History:  Procedure Laterality Date   APPENDECTOMY  1955   BACK SURGERY  1980   HERNIA REPAIR Left 2008   Family History:  Family History  Problem Relation Age of Onset   Bladder Cancer Neg Hx    Kidney cancer Neg Hx    Prostate cancer Neg Hx    Cancer Neg Hx    Family Psychiatric  History: Denies Social History: His Social History   Substance and Sexual Activity  Alcohol Use Not Currently   Comment: Rarely      Social History   Substance and Sexual Activity  Drug Use No    Social History   Socioeconomic History   Marital status: Married    Spouse name: Not on file   Number of children: Not on file   Years of education: Not on file   Highest education level: Not on file  Occupational History   Not on file  Tobacco Use   Smoking status: Never   Smokeless tobacco: Never  Vaping Use   Vaping Use: Never used  Substance and Sexual Activity   Alcohol use: Not Currently    Comment: Rarely   Drug use: No   Sexual activity: Not on file  Other Topics Concern   Not on file  Social History Narrative   Not on file   Social Determinants of Health   Financial Resource Strain: Not on file  Food Insecurity: Not on file  Transportation Needs: Not on file  Physical Activity: Not on file  Stress: Not on file  Social Connections: Not on file   Additional Social History:    Allergies:  No Known Allergies  Labs:  Results for orders placed or performed during the hospital encounter of 01/12/21 (from the past 48 hour(s))  Comprehensive metabolic panel     Status: Abnormal   Collection Time: 01/12/21  1:14 PM  Result Value Ref Range   Sodium 138 135 - 145 mmol/L   Potassium 4.3 3.5 - 5.1 mmol/L   Chloride 103 98 - 111 mmol/L   CO2 26 22 - 32 mmol/L   Glucose, Bld 105 (H) 70 - 99 mg/dL    Comment: Glucose reference range applies only to samples taken after fasting for at least 8 hours.   BUN 28 (H) 8 - 23 mg/dL   Creatinine, Ser 4.09 0.61 - 1.24 mg/dL   Calcium 9.7 8.9 - 81.1 mg/dL   Total Protein 7.9 6.5 - 8.1 g/dL   Albumin 4.1 3.5 - 5.0 g/dL   AST 34 15 - 41 U/L   ALT 19 0 - 44 U/L   Alkaline Phosphatase 79 38 - 126 U/L   Total Bilirubin 1.1 0.3 - 1.2 mg/dL   GFR, Estimated 57 (L) >60 mL/min    Comment: (NOTE) Calculated using the CKD-EPI Creatinine Equation (2021)    Anion gap 9 5 - 15    Comment: Performed at [facility], [address] [place], [place]  [address]  Ethanol     Status: None   Collection Time: 01/12/21  1:14 PM  Result Value Ref Range   Alcohol, Ethyl (B) <10 <10 mg/dL    Comment: (NOTE) Lowest detectable limit for serum alcohol is 10 mg/dL.  For medical purposes only. Performed  at [facility], [address] [place], [place] [address]   Salicylate level     Status: Abnormal   Collection Time: 01/12/21  1:14 PM  Result Value Ref Range   Salicylate Lvl <7.0 (L) 7.0 - 30.0 mg/dL    Comment: Performed at [facility], [address] [place], [place] [address]  Acetaminophen level     Status: Abnormal   Collection Time: 01/12/21  1:14 PM  Result Value Ref Range   Acetaminophen (Tylenol), Serum <10 (L) 10 - 30 ug/mL    Comment: (NOTE) Therapeutic concentrations vary significantly. A range of 10-30 ug/mL  may be an effective concentration for many patients. However, some  are best treated at concentrations outside of this range. Acetaminophen concentrations >150 ug/mL at 4 hours after ingestion  and >50 ug/mL at 12 hours after ingestion are often associated with  toxic reactions.  Performed at [facility], [address] [place], [place] [address]   cbc     Status: None   Collection Time: 01/12/21  1:14 PM  Result Value Ref Range   WBC 7.7 4.0 - 10.5 K/uL   RBC 4.38 4.22 - 5.81 MIL/uL   Hemoglobin 13.8 13.0 - 17.0 g/dL   HCT 21.3 08.6 - 57.8 %   MCV 92.0 80.0 - 100.0 fL   MCH 31.5 26.0 - 34.0 pg   MCHC 34.2 30.0 - 36.0 g/dL   RDW 46.9 62.9 - 52.8 %   Platelets 198 150 - 400 K/uL   nRBC 0.0 0.0 - 0.2 %    Comment: Performed at [facility], [address], [place], [place] [address]  Urine Drug Screen, Qualitative     Status: None   Collection Time: 01/12/21  1:14 PM  Result Value Ref Range   Tricyclic, Ur Screen NONE DETECTED NONE DETECTED   Amphetamines, Ur Screen NONE DETECTED NONE DETECTED   MDMA (Ecstasy)Ur Screen NONE DETECTED NONE DETECTED   Cocaine  Metabolite,Ur [place] NONE DETECTED NONE DETECTED   Opiate, Ur Screen NONE DETECTED NONE DETECTED   Phencyclidine (PCP) Ur S NONE DETECTED NONE DETECTED   Cannabinoid 50 Ng, Ur [place] NONE DETECTED NONE DETECTED   Barbiturates, Ur Screen NONE DETECTED NONE DETECTED   Benzodiazepine, Ur Scrn NONE DETECTED NONE DETECTED   Methadone Scn, Ur NONE DETECTED NONE DETECTED    Comment: (NOTE) Tricyclics + metabolites, urine    Cutoff 1000 ng/mL Amphetamines + metabolites, urine  Cutoff 1000 ng/mL MDMA (Ecstasy), urine              Cutoff 500 ng/mL Cocaine Metabolite, urine          Cutoff 300 ng/mL Opiate + metabolites, urine        Cutoff 300 ng/mL Phencyclidine (PCP), urine         Cutoff 25 ng/mL Cannabinoid, urine                 Cutoff 50 ng/mL Barbiturates + metabolites, urine  Cutoff 200 ng/mL Benzodiazepine, urine              Cutoff 200 ng/mL Methadone, urine                   Cutoff 300 ng/mL  The urine drug screen provides only a preliminary, unconfirmed analytical test result and should not be used for non-medical purposes. Clinical consideration and professional judgment should be applied to any positive drug screen result due to possible interfering substances. A more specific  alternate chemical method must be used in order to obtain a confirmed analytical result. Gas chromatography / mass spectrometry (GC/MS) is the preferred confirm atory method. Performed at [facility], [address] [place], [place] [address]   Urinalysis, Complete w Microscopic Urine, Clean Catch     Status: Abnormal   Collection Time: 01/12/21  1:14 PM  Result Value Ref Range   Color, Urine YELLOW (A) YELLOW   APPearance CLEAR (A) CLEAR   Specific Gravity, Urine 1.030 1.005 - 1.030    Comment: >=   pH 5.0 5.0 - 8.0   Glucose, UA NEGATIVE NEGATIVE mg/dL   Hgb urine dipstick NEGATIVE NEGATIVE   Bilirubin Urine SMALL (A) NEGATIVE   Ketones, ur NEGATIVE NEGATIVE mg/dL   Protein, ur 30 (A)  NEGATIVE mg/dL   Nitrite NEGATIVE NEGATIVE   Leukocytes,Ua NEGATIVE NEGATIVE   RBC / HPF 0-5 0 - 5 RBC/hpf   WBC, UA 0-5 0 - 5 WBC/hpf   Bacteria, UA NONE SEEN NONE SEEN   Squamous Epithelial / LPF NONE SEEN 0 - 5   Mucus PRESENT     Comment: Performed at [facility], [address] [place], [place] [address]  Resp Panel by RT-PCR (Flu A&B, Covid) Nasopharyngeal Swab     Status: None   Collection Time: 01/12/21  1:54 PM   Specimen: Nasopharyngeal Swab; Nasopharyngeal(NP) swabs in vial transport medium  Result Value Ref Range   SARS Coronavirus 2 by RT PCR NEGATIVE NEGATIVE    Comment: (NOTE) SARS-CoV-2 target nucleic acids are NOT DETECTED.  The SARS-CoV-2 RNA is generally detectable in upper respiratory specimens during the acute phase of infection. The lowest concentration of SARS-CoV-2 viral copies this assay can detect is 138 copies/mL. A negative result does not preclude SARS-Cov-2 infection and should not be used as the sole basis for treatment or other patient management decisions. A negative result may occur with  improper specimen collection/handling, submission of specimen other than nasopharyngeal swab, presence of viral mutation(s) within the areas targeted by this assay, and inadequate number of viral copies(<138 copies/mL). A negative result must be combined with clinical observations, patient history, and epidemiological information. The expected result is Negative.  Fact Sheet for Patients:  BloggerCourse.comhttps://www.fda.gov/media/152166/download  Fact Sheet for Healthcare Providers:  SeriousBroker.ithttps://www.fda.gov/media/152162/download  This test is no t yet approved or cleared by the [place] FDA and  has been authorized for detection and/or diagnosis of SARS-CoV-2 by FDA under an Emergency Use Authorization (EUA). This EUA will remain  in effect (meaning this test can be used) for the duration of the COVID-19 declaration under Section 564(b)(1) of the Act,  21 U.S.C.section 360bbb-3(b)(1), unless the authorization is terminated  or revoked sooner.       Influenza A by PCR NEGATIVE NEGATIVE   Influenza B by PCR NEGATIVE NEGATIVE    Comment: (NOTE) The Xpert Xpress SARS-CoV-2/FLU/RSV plus assay is intended as an aid in the diagnosis of influenza from Nasopharyngeal swab specimens and should not be used as a sole basis for treatment. Nasal washings and aspirates are unacceptable for Xpert Xpress SARS-CoV-2/FLU/RSV testing.  Fact Sheet for Patients: BloggerCourse.comhttps://www.fda.gov/media/152166/download  Fact Sheet for Healthcare Providers: SeriousBroker.ithttps://www.fda.gov/media/152162/download  This test is not yet approved or cleared by the [place] FDA and has been authorized for detection and/or diagnosis of SARS-CoV-2 by FDA under an Emergency Use Authorization (EUA). This EUA will remain in effect (meaning this test can be used) for the duration of the COVID-19 declaration under Section 564(b)(1) of  the Act, 21 U.S.C. section 360bbb-3(b)(1), unless the authorization is terminated or revoked.  Performed at [facility], [address] [place], [place] [address]     No current facility-administered medications for this encounter.   Current Outpatient Medications  Medication Sig Dispense Refill   acetaminophen (TYLENOL) 500 MG tablet Take 2 tablets (1,000 mg total) by mouth every 8 (eight) hours as needed for mild pain or moderate pain. 50 tablet 0   Desmopressin Acetate (NOCDURNA) 55.3 MCG SUBL Place 1 tablet under the tongue at bedtime. Take 1 hour prior to bedtime 8 tablet 0   diclofenac Sodium (VOLTAREN) 1 % GEL Apply 4 g topically 4 (four) times daily. 4 g 1   predniSONE (DELTASONE) 10 MG tablet Take 6 tabs day 1, 5 tabs day 2, 4 tabs day 3, 3 tabs day 4, 2 tabs day 5, 1 tab day 6 21 tablet 0   traMADol (ULTRAM) 50 MG tablet Take 1 tablet (50 mg total) by mouth every 6 (six) hours as needed. 20 tablet 0     Musculoskeletal: Strength & Muscle Tone: within normal limits Gait & Station: normal Patient leans: N/A   Psychiatric Specialty Exam:  Presentation  General Appearance:  Casual Eye Contact: Good Speech: Clear and Coherent Speech Volume: Normal Handedness: No data recorded  Mood and Affect  Mood: Anxious; Labile Affect: Labile; Congruent  Thought Process  Thought Processes: Disorganized Descriptions of Associations:No data recorded Orientation:Partial Thought Content:Illogical History of Schizophrenia/Schizoaffective disorder:No data recorded Duration of Psychotic Symptoms:No data recorded Hallucinations:Hallucinations: None (Denies) Ideas of Reference:None (Denies) Suicidal Thoughts:Suicidal Thoughts: No (Denies) Homicidal Thoughts:Homicidal Thoughts: No (Denies)  Sensorium  Memory: Immediate Poor; Recent Poor; Remote Poor Judgment: Impaired Insight: Lacking  Executive Functions  Concentration: Poor Attention Span: Fair Recall: Poor Fund of Knowledge: Fair Language: Fair  Psychomotor Activity  Psychomotor Activity: Psychomotor Activity: Normal  Assets  Assets: Health and safety inspector; Housing; Physical Health  Sleep  Sleep: No data recorded  Physical Exam: Physical Exam Vitals (in no acute distress) reviewed.  HENT:     Head: Normocephalic.     Nose: No congestion or rhinorrhea.  Eyes:     General:        Right eye: No discharge.        Left eye: No discharge.  Cardiovascular:     Rate and Rhythm: Bradycardia present.     Comments: No acute distress Pulmonary:     Effort: Pulmonary effort is normal.  Musculoskeletal:     Cervical back: Normal range of motion.  Skin:    General: Skin is warm and dry.  Neurological:     Mental Status: He is alert. He is disoriented.  Psychiatric:        Mood and Affect: Mood is anxious. Affect is labile.        Speech: Speech normal.        Behavior: Behavior is agitated.         Thought Content: Thought content does not include homicidal or suicidal ideation. Thought content does not include homicidal or suicidal plan.        Cognition and Memory: Cognition is impaired. Memory is impaired. He exhibits impaired recent memory and impaired remote memory.        Judgment: Judgment is impulsive.   Review of Systems  Constitutional: Negative.   HENT: Negative.    Eyes: Negative.   Respiratory: Negative.    Cardiovascular: Negative.   Gastrointestinal: Negative.   Genitourinary: Negative.   Musculoskeletal: Negative.  Skin: Negative.   Neurological: Negative.   Endo/Heme/Allergies: Negative.   Psychiatric/Behavioral:  Positive for memory loss. Negative for depression, hallucinations, substance abuse and suicidal ideas. The patient is nervous/anxious. The patient does not have insomnia.   Blood pressure (!) 160/83, pulse (!) 58, temperature 97.7 F (36.5 C), temperature source Oral, resp. rate 17, height 6\' 1"  (1.854 m), weight 81.6 kg, SpO2 97 %. Body mass index is 23.75 kg/m.  Treatment Plan Summary: Plan for this 87 year old male presenting to ED under IVC for  Altered Mental Status/possible dementia is to refer patient out to appropriate facilities  for evaluation and treatment. Patient is determined to be a risk to himself or others if discharged at this time.    Disposition: Recommend psychiatric Inpatient admission when medically cleared.  83, NP 01/12/2021 4:08 PM

## 2021-01-12 NOTE — BH Assessment (Signed)
Comprehensive Clinical Assessment (CCA) Note  01/12/2021 Timothy Robbins [id number]  Timothy Robbins, 87 year old male who presents to [facility] ED involuntarily for treatment. Per triage note, Pt comes into the ED via Timothy Robbins under IVC, reported that Robbins was called out to the resident 3 times this month, pt was found wondering the parking lot for hours looking for his car, pt locked himself out of his resident and did not know his wife is currently in a nursing home. Pt is calm and cooperative at present. Pt is not oriented to time or date, pt keeps asking if we know if his wife is in the hospital.   During TTS assessment pt presents alert and oriented x 2, anxious but cooperative, and mood-congruent with affect. The pt does not appear to be responding to internal or external stimuli. Neither is the pt presenting with any delusional thinking. Pt verified the information provided to triage RN.   Pt is unsure why he was brought to the ED. Patient states the only thing he remembers is that he was driving and on his way to [facility] for vacation. Patient reports he knows his wife is at a rehab facility and she has been there for about 2 weeks. Patient reports he has been eating 'fine", has good health, he is not on any prescription medications and he has been taking care of himself while his wife is away. Patient denies using any illicit substances and alcohol. Pt reports no INPT or OPT hx. Pt denies SI/HI/AH/VH or any previous attempts to harm himself. " I am not depressed." Per IVC paperwork, this is the 3rd time in one month where police have been called out to patient's home because patient has locked himself out the house and is wandering around the neighborhood, lost and confused.      Per Sallye Ober, NP pt is recommended for inpatient geriatric psychiatry admission.   Chief Complaint:  Chief Complaint  Patient presents with   Psychiatric Evaluation   Visit Diagnosis: Altered mental status    CCA Screening,  Triage and Referral (STR)  Patient Reported Information How did you hear about [place]? -- Mudlogger)  Referral name: No data recorded Referral phone number: No data recorded  Whom do you see for routine medical problems? No data recorded Practice/Facility Name: No data recorded Practice/Facility Phone Number: No data recorded Name of Contact: No data recorded Contact Number: No data recorded Contact Fax Number: No data recorded Prescriber Name: No data recorded Prescriber Address (if known): No data recorded  What Is the Reason for Your Visit/Call Today? Patient reports he is not sure why he was brought to the ED.  How Long Has This Been Causing You Problems? <Week  What Do You Feel Would Help You the Most Today? -- (Assessment)   Have You Recently Been in Any Inpatient Treatment (Hospital/Detox/Crisis Center/28-Day Program)? No data recorded Name/Location of Program/Hospital:No data recorded How Long Were You There? No data recorded When Were You Discharged? No data recorded  Have You Ever Received Services From [facility] Before? No data recorded Who Do You See at [facility]? No data recorded  Have You Recently Had Any Thoughts About Hurting Yourself? No  Are You Planning to Commit Suicide/Harm Yourself At This time? No   Have you Recently Had Thoughts About Hurting Someone Karolee Ohs? No  Explanation: No data recorded  Have You Used Any Alcohol or Drugs in the Past 24 Hours? No  How Long Ago Did You Use  Drugs or Alcohol? No data recorded What Did You Use and How Much? No data recorded  Do You Currently Have a Therapist/Psychiatrist? No  Name of Therapist/Psychiatrist: No data recorded  Have You Been Recently Discharged From Any Office Practice or Programs? No  Explanation of Discharge From Practice/Program: No data recorded    CCA Screening Triage Referral Assessment Type of Contact: Face-to-Face  Is this Initial or Reassessment? No data recorded Date  Telepsych consult ordered in [facility]L:  No data recorded Time Telepsych consult ordered in [facility]L:  No data recorded  Patient Reported Information Reviewed? No data recorded Patient Left Without Being Seen? No data recorded Reason for Not Completing Assessment: No data recorded  Collateral Involvement: None provided   Does Patient Have a Court Appointed Legal Guardian? No data recorded Name and Contact of Legal Guardian: No data recorded If Minor and Not Living with Parent(s), Who has Custody? n/a  Is CPS involved or ever been involved? Never  Is APS involved or ever been involved? Never   Patient Determined To Be At Risk for Harm To Self or Others Based on Review of Patient Reported Information or Presenting Complaint? No  Method: No data recorded Availability of Means: No data recorded Intent: No data recorded Notification Required: No data recorded Additional Information for Danger to Others Potential: No data recorded Additional Comments for Danger to Others Potential: No data recorded Are There Guns or Other Weapons in Your Home? No data recorded Types of Guns/Weapons: No data recorded Are These Weapons Safely Secured?                            No data recorded Who Could Verify You Are Able To Have These Secured: No data recorded Do You Have any Outstanding Charges, Pending Court Dates, Parole/Probation? No data recorded Contacted To Inform of Risk of Harm To Self or Others: No data recorded  Location of Assessment: [facility] ED   Does Patient Present under Involuntary Commitment? Yes  IVC Papers Initial File Date: 01/12/21   [place] of Residence:    Patient Currently Receiving the Following Services: Not Receiving Services   Determination of Need: Urgent (48 hours)   Options For Referral: ED Visit; Geropsychiatric Facility; Medication Management     CCA Biopsychosocial Intake/Chief Complaint:  No data recorded Current Symptoms/Problems: No data  recorded  Patient Reported Schizophrenia/Schizoaffective Diagnosis in Past: No   Strengths: Communication and some insight  Preferences: No data recorded Abilities: No data recorded  Type of Services Patient Feels are Needed: No data recorded  Initial Clinical Notes/Concerns: No data recorded  Mental Health Symptoms Depression:   None   Duration of Depressive symptoms: No data recorded  Mania:   None   Anxiety:    None   Psychosis:   None   Duration of Psychotic symptoms: No data recorded  Trauma:   None   Obsessions:   None   Compulsions:   None   Inattention:   Forgetful   Hyperactivity/Impulsivity:   None   Oppositional/Defiant Behaviors:   None   Emotional Irregularity:   None   Other Mood/Personality Symptoms:  No data recorded   Mental Status Exam Appearance and self-care  Stature:   Tall   Weight:   Average weight   Clothing:   Age-appropriate; Neat/clean   Grooming:   Normal   Cosmetic use:   None   Posture/gait:   Normal   Motor activity:   Not  Remarkable   Sensorium  Attention:   Confused   Concentration:  No data recorded  Orientation:   Person; Place   Recall/memory:   Defective in Recent; Defective in Remote   Affect and Mood  Affect:   Appropriate   Mood:  No data recorded  Relating  Eye contact:   Normal   Facial expression:   Responsive   Attitude toward examiner:   Cooperative   Thought and Language  Speech flow:  Clear and Coherent   Thought content:   Appropriate to Mood and Circumstances   Preoccupation:   None   Hallucinations:   None   Organization:  No data recorded  [employer] of Knowledge:  No data recorded  Intelligence:  No data recorded  Abstraction:  No data recorded  Judgement:  No data recorded  Reality Testing:  No data recorded  Insight:  No data recorded  Decision Making:  No data recorded  Social Functioning  Social Maturity:  No data recorded   Social Judgement:  No data recorded  Stress  Stressors:  No data recorded  Coping Ability:  No data recorded  Skill Deficits:  No data recorded  Supports:  No data recorded    Religion:    Leisure/Recreation:    Exercise/Diet:     CCA Employment/Education Employment/Work Situation:    Education:     CCA Family/Childhood History Family and Relationship History: Family history Marital status: Married What types of issues is patient dealing with in the relationship?: Patient's wife was in the hospital for hip surgery and is currently in rehab. Does patient have children?: Yes How many children?: 1 How is patient's relationship with their children?: Patient reports having a good relationship with his daughter.  Childhood History:     Child/Adolescent Assessment:     CCA Substance Use Alcohol/Drug Use: Alcohol / Drug Use Pain Medications: See PTA Prescriptions: See PTA Over the Counter: See PTA History of alcohol / drug use?: No history of alcohol / drug abuse                         ASAM's:  Six Dimensions of Multidimensional Assessment  Dimension 1:  Acute Intoxication and/or Withdrawal Potential:      Dimension 2:  Biomedical Conditions and Complications:      Dimension 3:  Emotional, Behavioral, or Cognitive Conditions and Complications:     Dimension 4:  Readiness to Change:     Dimension 5:  Relapse, Continued use, or Continued Problem Potential:     Dimension 6:  Recovery/Living Environment:     ASAM Severity Score:    ASAM Recommended Level of Treatment:     Substance use Disorder (SUD)    Recommendations for Services/Supports/Treatments:    DSM5 Diagnoses: Patient Active Problem List   Diagnosis Date Noted   Altered mental status 01/12/2021   Elevated PSA     Patient Centered Plan: Patient is on the following Treatment Plan(s):     Referrals to Alternative Service(s): Referred to Alternative Service(s):   Place:    Date:   Time:    Referred to Alternative Service(s):   Place:   Date:   Time:    Referred to Alternative Service(s):   Place:   Date:   Time:    Referred to Alternative Service(s):   Place:   Date:   Time:     Tanisa Lagace Dierdre Searles, Counselor, LCAS-A

## 2021-01-12 NOTE — ED Notes (Signed)
OT at bedside for assessment

## 2021-01-12 NOTE — ED Notes (Signed)
Wife Lupita Leash called ER to speak with husband. Returned call at, [phone]. State that she is at [facility].

## 2021-01-12 NOTE — Evaluation (Addendum)
Occupational Therapy Evaluation Patient Details Name: Timothy Robbins MRN: [id number] DOB: Dec 08, 1933 Today's Date: 01/12/2021    History of Present Illness Timothy Robbins is a 87 y.o. male patient admitted with AMS. Pt brought to hospital by [facility] police after pt found to have locked himself out of his home multiple times, neighbours/friend concerned for memory loss, and IVC obtained.   Clinical Impression   Timothy Robbins was seen for OT evaluation this date. Prior to hospital admission, pt was Independent for mobility and I/ADLs. Pt lives alone as spouse is currently resident at [facility]. Pt presents to acute OT demonstrating impaired ADL performance and functional cognition 2/2 poor insight into deficits, decreased safety awareness, and memory deficits. Pt currently requires SUPERVISION for IADLs for safety. Pt initially stating location as [place] then repeatedly stating throughout session "I need to get back to [place]." Pt oriented to day of week as Wednesday then ~2 min later repeated orientation questions and pt unable to answer stating "I remember things that are important, if that was important I would remember it."   Pt completed SLUMS examination this date scoring 10/30 indicating a positive screen for dementia. Of note, it is not within occupational therapy scope of practice to diagnose cognitive impairments, this screen indicates need for further testing. The SLUMS is a 30 point, 11 question screening questionnaire that tests orientation, memory, attention, and executive function. Pt with noted impairments in short term memory, problem solving, and executive function limiting ability to participate functionally in IADLs safely. Pt became agitated during testing repeatedly requiring redirection t/o test (pt appeared angry at questions and threw cup of ice into sink/against wall mid test).    Pt completed the Orientation Log (O-Log), a quick quantitative measure of orientational status for  use at bedside with rehabilitation inpatients. Patient responses are scored based on spontaneous recall, logical cueing, multiple choice, and incorrect responses. Pt scored 19/30, unable to state name of hospital or situation. Pt would benefit from skilled OT to address noted impairments and functional limitations (see below for any additional details) in order to maximize safety and independence while minimizing falls risk and caregiver burden. Upon hospital discharge, recommend SUPERVISION 24/7 for safety, if pt unable to have 24/7 supervision pt may benefit from higher level of care for supervision/safety.     Follow Up Recommendations  Supervision/Assistance - 24 hour;Home health OT    Equipment Recommendations  None recommended by OT    Recommendations for Other Services       Precautions / Restrictions Precautions Precautions: None Restrictions Weight Bearing Restrictions: No      Mobility Bed Mobility Overal bed mobility: Independent                  Transfers Overall transfer level: Independent                    Balance Overall balance assessment: No apparent balance deficits (not formally assessed)                                         ADL either performed or assessed with clinical judgement   ADL Overall ADL's : Needs assistance/impaired  General ADL Comments: SUPERVISION for safety      Pertinent Vitals/Pain Pain Assessment: No/denies pain     Hand Dominance Right   Extremity/Trunk Assessment Upper Extremity Assessment Upper Extremity Assessment: Overall WFL for tasks assessed   Lower Extremity Assessment Lower Extremity Assessment: Overall WFL for tasks assessed       Communication Communication Communication: No difficulties   Cognition Arousal/Alertness: Awake/alert Behavior During Therapy: Agitated Overall Cognitive Status: No family/caregiver present to  determine baseline cognitive functioning                                 General Comments: SLUMS scoring 10/30   General Comments       Exercises Exercises: Other exercises Other Exercises Other Exercises: Pt educated re: OT role, d/c recs, safety awareness, re-oriented Other Exercises: ~20 min standing balance/tolerance, SLUMS   Shoulder Instructions      Home Living Family/patient expects to be discharged to:: Private residence Living Arrangements: Alone (spouse at [facility] following hip repelacement) Available Help at Discharge: Friend(s);Available PRN/intermittently Type of Home: House Home Access: Stairs to enter [employer] of Steps: 4 Entrance Stairs-Rails: Can reach both Home Layout: One level     Bathroom Shower/Tub: Tub/shower unit                    Prior Functioning/Environment Level of Independence: Independent        Comments: Drives, reports wife manages groceries but unable to state what he would do for groceries while he is home alone        OT Problem List: Decreased cognition;Decreased safety awareness      OT Treatment/Interventions: Self-care/ADL training;Therapeutic exercise;Energy conservation;DME and/or AE instruction;Therapeutic activities;Patient/family education;Balance training    OT Goals(Current goals can be found in the care plan section) Acute Rehab OT Goals Patient Stated Goal: to go home OT Goal Formulation: With patient Time For Goal Achievement: 01/26/21 Potential to Achieve Goals: Fair ADL Goals Additional ADL Goal #1: Pt will Independently verbalize plan to implement x3 falls prevention strategies Additional ADL Goal #2: Pt will complete simple meal preparation task with MIN cues for sequencing. Additional ADL Goal #3: Pt will complete medication mgmt task with MIN cues  OT Frequency: Min 1X/week   Barriers to D/C: Decreased caregiver support          Co-evaluation               AM-PAC OT "6 Clicks" Daily Activity     Outcome Measure Help from another person eating meals?: None Help from another person taking care of personal grooming?: A Little Help from another person toileting, which includes using toliet, bedpan, or urinal?: None Help from another person bathing (including washing, rinsing, drying)?: A Little Help from another person to put on and taking off regular upper body clothing?: None Help from another person to put on and taking off regular lower body clothing?: A Little 6 Click Score: 21   End of Session Nurse Communication: Other (comment) (pt requesting to leave)  Activity Tolerance: Patient tolerated treatment well;Treatment limited secondary to agitation Patient left:  (standing in room)  OT Visit Diagnosis: Other abnormalities of gait and mobility (R26.89)                Time: 1538-1610 OT Time Calculation (min): 32 min Charges:  OT General Charges $OT Visit: 1 Visit OT Evaluation $OT Eval Low Complexity: 1 Low OT  Treatments $Therapeutic Activity: 23-37 mins Kathie Dike, M.S. OTR/L  01/12/21, 4:49 PM  ascom [phone]

## 2021-01-12 NOTE — ED Provider Notes (Signed)
/  [facility] Emergency Department Provider Note  ____________________________________________   Event Date/Time   First MD Initiated Contact with Patient 01/12/21 1340     (approximate)  I have reviewed the triage vital signs and the nursing notes.   HISTORY  Chief Complaint Psychiatric Evaluation   HPI Timothy Robbins is a 87 y.o. male who denies any significant past medical history who presents in police custody after IVC paperwork was filled out by psychiatry crisis team when police were called out to the patient department where they found the patient wandering around parking lot confused looking for his car.  Patient had apparently locked himself out of his home.  Patient states he was actually in her home earlier this morning was having some difficulty getting into his front door.  He is not sure why the police picked him up.  Denies any difficulty with his memory or feeling confused or any other acute sick symptoms.  Specifically denies any injuries or falls, illicit drug use, EtOH use, headache, earache, sore throat, vision changes, chest pain, cough, shortness of breath, abdominal pain, vomiting, diarrhea, dysuria, rash or any other sick symptoms.  States he wishes to leave soon as possible.  Patient states he is not sure if his wife is in the hospital or not but wishes for this examiner to check.         Past Medical History:  Diagnosis Date   Elevated PSA     Patient Active Problem List   Diagnosis Date Noted   Elevated PSA     Past Surgical History:  Procedure Laterality Date   APPENDECTOMY  1955   BACK SURGERY  1980   HERNIA REPAIR Left 2008    Prior to Admission medications   Medication Sig Start Date End Date Taking? Authorizing Provider  acetaminophen (TYLENOL) 500 MG tablet Take 2 tablets (1,000 mg total) by mouth every 8 (eight) hours as needed for mild pain or moderate pain. 03/24/20 03/24/21  Nita Sickle, MD   Desmopressin Acetate (NOCDURNA) 55.3 MCG SUBL Place 1 tablet under the tongue at bedtime. Take 1 hour prior to bedtime 12/16/17   Stoioff, Verna Czech, MD  diclofenac Sodium (VOLTAREN) 1 % GEL Apply 4 g topically 4 (four) times daily. 03/24/20   Nita Sickle, MD  predniSONE (DELTASONE) 10 MG tablet Take 6 tabs day 1, 5 tabs day 2, 4 tabs day 3, 3 tabs day 4, 2 tabs day 5, 1 tab day 6 11/23/19   Baity, Salvadore Oxford, NP  traMADol (ULTRAM) 50 MG tablet Take 1 tablet (50 mg total) by mouth every 6 (six) hours as needed. 03/24/20 03/24/21  Nita Sickle, MD    Allergies Patient has no known allergies.  Family History  Problem Relation Age of Onset   Bladder Cancer Neg Hx    Kidney cancer Neg Hx    Prostate cancer Neg Hx    Cancer Neg Hx     Social History Social History   Tobacco Use   Smoking status: Never   Smokeless tobacco: Never  Vaping Use   Vaping Use: Never used  Substance Use Topics   Alcohol use: Not Currently    Comment: Rarely   Drug use: No    Review of Systems  Review of Systems  Constitutional:  Negative for chills and fever.  HENT:  Negative for sore throat.   Eyes:  Negative for pain.  Respiratory:  Negative for cough and stridor.   Cardiovascular:  Negative for  chest pain.  Gastrointestinal:  Negative for vomiting.  Genitourinary:  Negative for dysuria.  Musculoskeletal:  Negative for myalgias.  Skin:  Negative for rash.  Neurological:  Negative for seizures, loss of consciousness and headaches.  Psychiatric/Behavioral:  Positive for memory loss. Negative for suicidal ideas.   All other systems reviewed and are negative.    ____________________________________________   PHYSICAL EXAM:  VITAL SIGNS: ED Triage Vitals  Enc Vitals Group     BP 01/12/21 1307 (!) 160/83     Pulse Rate 01/12/21 1307 (!) 58     Resp 01/12/21 1307 17     Temp 01/12/21 1307 97.7 F (36.5 C)     Temp Source 01/12/21 1307 Oral     SpO2 01/12/21 1307 97 %     Weight  01/12/21 1309 180 lb (81.6 kg)     Height 01/12/21 1309 6\' 1"  (1.854 m)     Head Circumference --      Peak Flow --      Pain Score 01/12/21 1308 0     Pain Loc --      Pain Edu? --      Excl. in GC? --    Vitals:   01/12/21 1307  BP: (!) 160/83  Pulse: (!) 58  Resp: 17  Temp: 97.7 F (36.5 C)  SpO2: 97%   Physical Exam Vitals and nursing note reviewed.  Constitutional:      Appearance: He is well-developed.  HENT:     Head: Normocephalic and atraumatic.     Right Ear: External ear normal.     Left Ear: External ear normal.     Nose: Nose normal.     Mouth/Throat:     Mouth: Mucous membranes are moist.  Eyes:     Conjunctiva/sclera: Conjunctivae normal.  Cardiovascular:     Rate and Rhythm: Normal rate and regular rhythm.     Heart sounds: No murmur heard. Pulmonary:     Effort: Pulmonary effort is normal. No respiratory distress.     Breath sounds: Normal breath sounds.  Abdominal:     Palpations: Abdomen is soft.     Tenderness: There is no abdominal tenderness.  Musculoskeletal:     Cervical back: Neck supple.  Skin:    General: Skin is warm and dry.  Neurological:     Mental Status: He is alert. He is disoriented.     Cranial Nerves: No cranial nerve deficit.     Sensory: No sensory deficit.     Gait: Gait normal.    Cranial nerves II through XII grossly intact.  Patient has symmetric strength in his upper and lower extremities.  Sensation is intact to light touch all extremities.  He has steady gait.  He has no pronator drift or finger dysmetria.  Patient is oriented to month only.  He is not oriented to date, year or location.  ____________________________________________   LABS (all labs ordered are listed, but only abnormal results are displayed)  Labs Reviewed  COMPREHENSIVE METABOLIC PANEL - Abnormal; Notable for the following components:      Result Value   Glucose, Bld 105 (*)    BUN 28 (*)    GFR, Estimated 57 (*)    All other components  within normal limits  SALICYLATE LEVEL - Abnormal; Notable for the following components:   Salicylate Lvl <7.0 (*)    All other components within normal limits  ACETAMINOPHEN LEVEL - Abnormal; Notable for the following components:   Acetaminophen (Tylenol),  Serum <10 (*)    All other components within normal limits  URINALYSIS, COMPLETE (UACMP) WITH MICROSCOPIC - Abnormal; Notable for the following components:   Color, Urine YELLOW (*)    APPearance CLEAR (*)    Bilirubin Urine SMALL (*)    Protein, ur 30 (*)    All other components within normal limits  RESP PANEL BY RT-PCR (FLU A&B, COVID) ARPGX2  ETHANOL  CBC  URINE DRUG SCREEN, QUALITATIVE ([facility] ONLY)   ____________________________________________  EKG  ____________________________________________  RADIOLOGY  ED MD interpretation: CT head is unremarkable for any acute intracranial abnormality.  Official radiology report(s): CT HEAD WO CONTRAST ( )  Result Date: 01/12/2021 CLINICAL DATA:  Mental status change EXAM: CT HEAD WITHOUT CONTRAST TECHNIQUE: Contiguous axial images were obtained from the base of the skull through the vertex without intravenous contrast. COMPARISON:  Head CT dated November 20, 2019 FINDINGS: Brain: No evidence of acute infarction, hemorrhage, hydrocephalus, extra-axial collection or mass lesion/mass effect. Vascular: No hyperdense vessel or unexpected calcification. Skull: Normal. Negative for fracture or focal lesion. Sinuses/Orbits: No acute finding. Other: None. IMPRESSION: No acute intracranial abnormalities. Electronically Signed   By: Allegra Lai M.D.   On: 01/12/2021 14:57    ____________________________________________   PROCEDURES  Procedure(s) performed (including Critical Care):  Procedures  ____________________________________________   INITIAL IMPRESSION / ASSESSMENT AND PLAN / ED COURSE      Patient presents with above-stated history and exam accompanied by police after IVC  paperwork filled out by crisis team called because patient was confused.  Seems patient's crisis team was concerned he may have dementia versus underlying psychiatric illness.  Patient denies any acute concerns although is not oriented.  Arrival patient is hypertensive with otherwise stable vital signs on room air.  He is not oriented but otherwise a nonfocal neuro exam.  Does not appear acutely intoxicated or psychotic.  Concern for possible undiagnosed dementia versus metabolic derangements versus undiagnosed psychiatric disorder.  CT head is unremarkable for any acute intracranial abnormality.  Patient has a nonfocal exam and there is no evidence of CVA.  No historical or exam features to suggest recent trauma.  CMP shows no significant electrolyte or metabolic derangements.  Serum ethanol, salicylate level, acetaminophen level undetectable.  UDS is negative.  CBC shows no leukocytosis or acute anemia.  UA is not suggestive of infection.  UDS is negative.  COVID influenza PCR is negative.  Overall lower suspicion after above work-up for acute pathology leading to presentation today.  More concern for possible dementia versus psychiatry disorder.  Will consult psychiatry and TTS as well as OT.  The patient has been placed in psychiatric observation due to the need to provide a safe environment for the patient while obtaining psychiatric consultation and evaluation, as well as ongoing medical and medication management to treat the patient's condition.  The patient has been placed under full IVC at this time.    ____________________________________________   FINAL CLINICAL IMPRESSION(S) / ED DIAGNOSES  Final diagnoses:  Confused    Medications - No data to display   ED Discharge Orders     None        Note:  This document was prepared using Dragon voice recognition software and may include unintentional dictation errors.    Gilles Chiquito, MD 01/12/21 1524

## 2021-01-12 NOTE — ED Notes (Signed)
Dinner tray delivered to pt. Pt eating dinner @ this time.  

## 2021-01-12 NOTE — ED Notes (Signed)
Pt again attempting to exit treatment area and go to EMS bay to go home. Pt is continuing to be upset with staff due to being here and not being allowed to leave. Pt is redirectable but upset. Dr. Darnelle Catalan to room and communicates with patient, states to continue to hold on IM injection as it would require manual holds. This nurse, Security Lead, Wiley, and Dr. Darnelle Catalan feel patient would be appropriate for BHU unit, will communicate with nurse

## 2021-01-13 NOTE — ED Notes (Signed)
Unlocked bathroom door to allow patient to use restroom. Pt exited and bathroom door locked behind patient by staff.  

## 2021-01-13 NOTE — ED Notes (Signed)
IVC, patient accepted to [facility] after 12pm today

## 2021-01-13 NOTE — ED Notes (Signed)
Refused repeat VS

## 2021-01-13 NOTE — ED Notes (Signed)
Discharged to [facility] to [facility]. 1 belonging bag given to [facility]. Pt ambulatory.

## 2021-01-13 NOTE — ED Notes (Signed)
Attempted to call wife, Lupita Leash to update about transfer to [facility] answer and VM full. Called [place] with number that wife called from yesterday in attempt to reach wife; states he will relay message to her to call the Emergency Room. No information of patient given out to pastor.

## 2021-01-13 NOTE — ED Notes (Signed)
Unlocked bathroom door to allow patient to use restroom. Pt exited and bathroom door locked behind patient by staff. Pt looking for his wife.

## 2021-04-03 ENCOUNTER — Emergency Department (HOSPITAL_COMMUNITY): Payer: Medicare PPO

## 2021-04-03 ENCOUNTER — Emergency Department (HOSPITAL_COMMUNITY)
Admission: EM | Admit: 2021-04-03 | Discharge: 2021-04-03 | Disposition: A | Discharge status: Skilled Nursing Facility | Payer: Medicare PPO | Attending: Emergency Medicine | Admitting: Emergency Medicine

## 2021-04-03 DIAGNOSIS — U071 COVID-19: Secondary | ICD-10-CM | POA: Diagnosis not present

## 2021-04-03 DIAGNOSIS — R2681 Unsteadiness on feet: Secondary | ICD-10-CM | POA: Diagnosis present

## 2021-04-03 DIAGNOSIS — F0390 Unspecified dementia without behavioral disturbance: Secondary | ICD-10-CM | POA: Insufficient documentation

## 2021-04-03 LAB — CBC WITH DIFFERENTIAL/PLATELET
Abs Immature Granulocytes: 0.03 10*3/uL (ref 0.00–0.07)
Basophils Absolute: 0 10*3/uL (ref 0.0–0.1)
Basophils Relative: 0 %
Eosinophils Absolute: 0 10*3/uL (ref 0.0–0.5)
Eosinophils Relative: 0 %
HCT: 39.4 % (ref 39.0–52.0)
Hemoglobin: 13.2 g/dL (ref 13.0–17.0)
Immature Granulocytes: 0 %
Lymphocytes Relative: 9 %
Lymphs Abs: 1 10*3/uL (ref 0.7–4.0)
MCH: 32 pg (ref 26.0–34.0)
MCHC: 33.5 g/dL (ref 30.0–36.0)
MCV: 95.4 fL (ref 80.0–100.0)
Monocytes Absolute: 0.8 10*3/uL (ref 0.1–1.0)
Monocytes Relative: 8 %
Neutro Abs: 8.7 10*3/uL — ABNORMAL HIGH (ref 1.7–7.7)
Neutrophils Relative %: 83 %
Platelets: 154 10*3/uL (ref 150–400)
RBC: 4.13 MIL/uL — ABNORMAL LOW (ref 4.22–5.81)
RDW: 12.9 % (ref 11.5–15.5)
WBC: 10.6 10*3/uL — ABNORMAL HIGH (ref 4.0–10.5)
nRBC: 0 % (ref 0.0–0.2)

## 2021-04-03 LAB — COMPREHENSIVE METABOLIC PANEL
ALT: 17 U/L (ref 0–44)
AST: 36 U/L (ref 15–41)
Albumin: 3.8 g/dL (ref 3.5–5.0)
Alkaline Phosphatase: 69 U/L (ref 38–126)
Anion gap: 8 (ref 5–15)
BUN: 27 mg/dL — ABNORMAL HIGH (ref 8–23)
CO2: 25 mmol/L (ref 22–32)
Calcium: 9.3 mg/dL (ref 8.9–10.3)
Chloride: 103 mmol/L (ref 98–111)
Creatinine, Ser: 1.38 mg/dL — ABNORMAL HIGH (ref 0.61–1.24)
GFR, Estimated: 50 mL/min — ABNORMAL LOW (ref >60)
Glucose, Bld: 115 mg/dL — ABNORMAL HIGH (ref 70–99)
Potassium: 4 mmol/L (ref 3.5–5.1)
Sodium: 136 mmol/L (ref 135–145)
Total Bilirubin: 0.8 mg/dL (ref 0.3–1.2)
Total Protein: 6.7 g/dL (ref 6.5–8.1)

## 2021-04-03 LAB — URINALYSIS, ROUTINE W REFLEX MICROSCOPIC
Bilirubin Urine: NEGATIVE
Glucose, UA: NEGATIVE mg/dL
Hgb urine dipstick: NEGATIVE
Ketones, ur: NEGATIVE mg/dL
Leukocytes,Ua: NEGATIVE
Nitrite: NEGATIVE
Protein, ur: NEGATIVE mg/dL
Specific Gravity, Urine: 1.025 (ref 1.005–1.030)
pH: 5 (ref 5.0–8.0)

## 2021-04-03 LAB — RESP PANEL BY RT-PCR (FLU A&B, COVID) ARPGX2
Influenza A by PCR: NEGATIVE
Influenza B by PCR: NEGATIVE
SARS Coronavirus 2 by RT PCR: POSITIVE — AB

## 2021-04-03 LAB — APTT: aPTT: 32 seconds (ref 24–36)

## 2021-04-03 LAB — TROPONIN I (HIGH SENSITIVITY): Troponin I (High Sensitivity): 8 ng/L (ref <18)

## 2021-04-03 LAB — PROTIME-INR
INR: 1.1 (ref 0.8–1.2)
Prothrombin Time: 14.5 seconds (ref 11.4–15.2)

## 2021-04-03 LAB — LACTIC ACID, PLASMA: Lactic Acid, Venous: 1.6 mmol/L (ref 0.5–1.9)

## 2021-04-03 IMAGING — DX DG CHEST 1V PORT
1 series · 1 of 1 positions shown · non-contrast
Comparison: None.

CLINICAL DATA: Sepsis.

EXAM:
PORTABLE CHEST 1 VIEW

[chest]
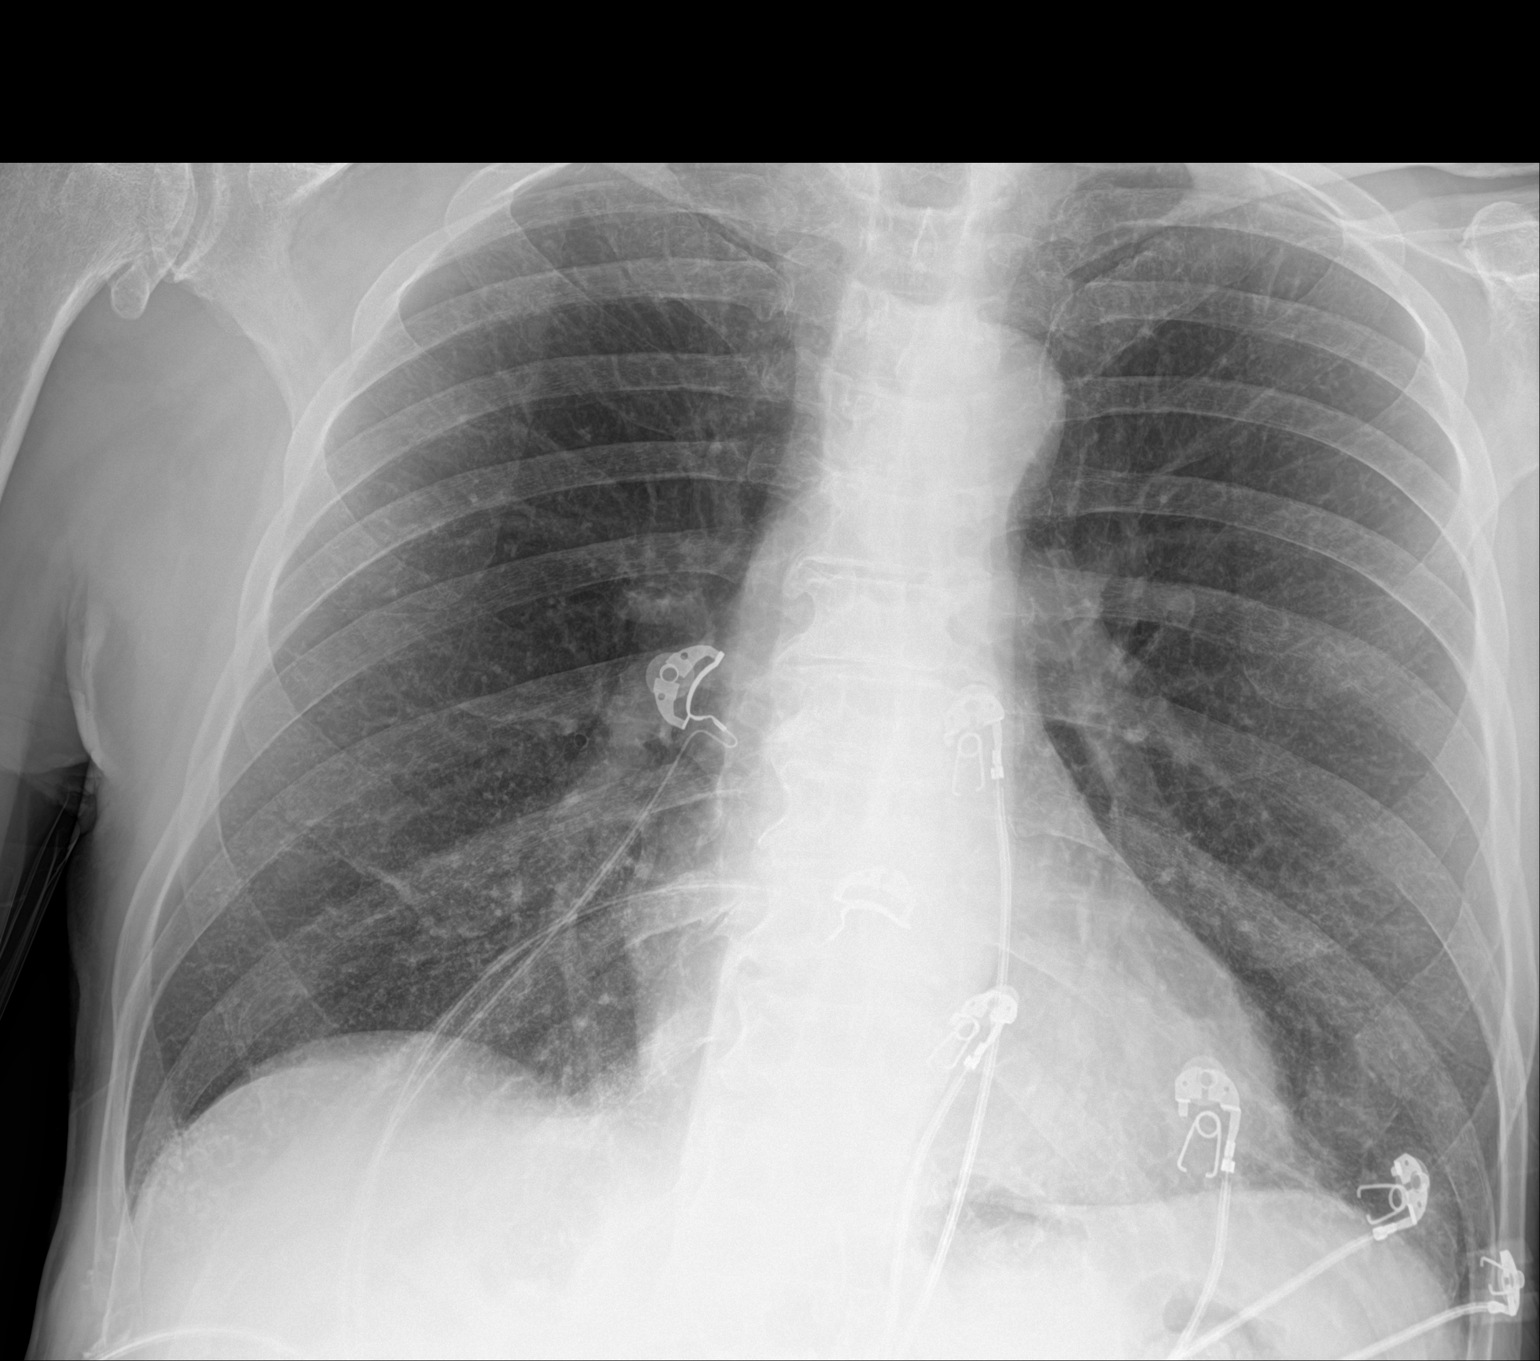

[1 of 1 positions shown; findings below may reference images not displayed]

FINDINGS: The heart and mediastinal contours are within normal limits. Aortic
calcification

No focal consolidation. No pulmonary edema. No pleural effusion. No
pneumothorax.

No acute osseous abnormality.  Right shoulder degenerative changes.
IMPRESSION: No active disease.

## 2021-04-03 MED ORDER — STERILE WATER FOR INJECTION IJ SOLN: 2.0000 mL | Freq: Once | INTRAMUSCULAR | Status: AC

## 2021-04-03 MED ORDER — NIRMATRELVIR/RITONAVIR (PAXLOVID)TABLET: 3.0000 | ORAL_TABLET | Freq: Two times a day (BID) | ORAL | 0 refills | Status: AC

## 2021-04-03 MED ORDER — ZIPRASIDONE MESYLATE 20 MG IM SOLR: 20.0000 mg | Freq: Once | INTRAMUSCULAR | Status: AC

## 2021-04-03 MED ORDER — ZIPRASIDONE MESYLATE 20 MG IM SOLR
INTRAMUSCULAR | Status: AC
  Administered 2021-04-03: 16:00:00 20 mg via INTRAMUSCULAR
  Filled 2021-04-03: qty 20

## 2021-04-03 MED ORDER — STERILE WATER FOR INJECTION IJ SOLN
INTRAMUSCULAR | Status: AC
  Administered 2021-04-03: 16:00:00 2 mL via INTRAMUSCULAR
  Filled 2021-04-03: qty 10

## 2021-04-03 NOTE — ED Notes (Signed)
Pt resting comfortably in stretcher beside nurses station, no complaints at this time.

## 2021-04-03 NOTE — Discharge Instructions (Addendum)
Timothy Robbins tested positive for Covid -19.  This is likely causing his symptoms.  We did not see signs of dehydration, hypoxia, or other emergency need for hospitalization based on his work-up.  He will need to be reminded to drink plenty of fluids over the next few days.  Because of his age, he would qualify for antiviral therapy with Paxlovid pills, which were prescribed.  These medicines may help shorten the duration of his symptoms and prevent severe illness.  Please have him follow-up with his doctor in 1 to 2 days to have his vital signs rechecked and his oxygen level rechecked.  If his oxygen level drops below 88%, or is having difficulty breathing, he will need to come back to the emergency department.  *  Please note that due to agitation and noncompliance in the ER, Timothy Robbins was given 20 mg of Geodon as a sedative.  This was the only way to allow [place] to complete a medical work-up including blood tests.  He may be drowsier than normal this evening and should be advised to sleep in bed when he arrives back to the facility.  *  [name] was updated by phone about his medical workup and plan for discharge from the hospital.

## 2021-04-03 NOTE — ED Notes (Signed)
Unable to obtain second set of cultures, pt is combative and refusing
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
## 2021-04-03 NOTE — ED Notes (Signed)
Attempted to call report to [facility], no answer. Wife is aware pt is returning to facility.

## 2021-04-03 NOTE — ED Triage Notes (Signed)
Pt bib [facility] from [facility] for unsteady gait. Unknown syptom onset. Pt has hx of dementia, pt is at baseline upon arrival. Denies any pain.   EMS vitals 134/74 76 HR 97% SpO2 148 CBG 20 RR

## 2021-04-03 NOTE — ED Notes (Addendum)
RN heard pt yelling, RN alerted by another hallway pt that the man was on the floor. RN went into room and found pt naked on the floor, at the end of the bed on his knees. Pt was attempting to stand up, saying "I'm getting out of here" and "I need to go home". Rn alerted more staff to come help, pt was assisted back into bed. Trifan MD allowed [place] to remove monitoring equipment as pt was attempting to pull everything off. Pt has no visible injuries.

## 2021-04-03 NOTE — ED Notes (Signed)
Timothy Robbins [phone] would like an update

## 2021-04-03 NOTE — ED Provider Notes (Signed)
[name] EMERGENCY DEPARTMENT Provider Note   CSN: [id number] Arrival date & time: 04/03/21  1428     History Chief Complaint  Patient presents with   Gait Problem    Timothy Robbins is a 87 y.o. male with a history of dementia presenting from [facility] with concern for unsteady gait.  The patient himself cannot provide any further history.  He has no complaints.  EMS reported overall normal blood signs, do not know temperature was recorded by them.  HPI     Past Medical History:  Diagnosis Date   Elevated PSA     Patient Active Problem List   Diagnosis Date Noted   Altered mental status 01/12/2021   Elevated PSA     Past Surgical History:  Procedure Laterality Date   APPENDECTOMY  1955   BACK SURGERY  1980   HERNIA REPAIR Left 2008       Family History  Problem Relation Age of Onset   Bladder Cancer Neg Hx    Kidney cancer Neg Hx    Prostate cancer Neg Hx    Cancer Neg Hx     Social History   Tobacco Use   Smoking status: Never   Smokeless tobacco: Never  Vaping Use   Vaping Use: Never used  Substance Use Topics   Alcohol use: Not Currently    Comment: Rarely   Drug use: No    Home Medications Prior to Admission medications   Medication Sig Start Date End Date Taking? Authorizing Provider  nirmatrelvir/ritonavir EUA (PAXLOVID) 20 x 150 MG & 10 x 100MG  TABS Take 3 tablets by mouth 2 (two) times daily for 5 days. Patient GFR is >60 Take nirmatrelvir (150 mg) two tablets twice daily for 5 days and ritonavir (100 mg) one tablet twice daily for 5 days. 04/03/21 04/08/21 Yes Joziah Dollins, 14/2/22, MD  Desmopressin Acetate (NOCDURNA) 55.3 MCG SUBL Place 1 tablet under the tongue at bedtime. Take 1 hour prior to bedtime Patient not taking: Reported on 01/13/2021 12/16/17   02/15/18, MD  diclofenac Sodium (VOLTAREN) 1 % GEL Apply 4 g topically 4 (four) times daily. 03/24/20   03/26/20, MD  predniSONE (DELTASONE) 10 MG  tablet Take 6 tabs day 1, 5 tabs day 2, 4 tabs day 3, 3 tabs day 4, 2 tabs day 5, 1 tab day 6 Patient not taking: Reported on 01/13/2021 11/23/19   11/25/19, NP    Allergies    Patient has no known allergies.  Review of Systems   Review of Systems  Unable to perform ROS: Dementia (level 5 ceveat)   Physical Exam Updated Vital Signs BP (!) 119/55   Pulse 79   Temp 99.3 F (37.4 C) (Oral)   Resp 18   SpO2 97%   Physical Exam Constitutional:      General: He is not in acute distress. HENT:     Head: Normocephalic and atraumatic.  Eyes:     Conjunctiva/sclera: Conjunctivae normal.     Pupils: Pupils are equal, round, and reactive to light.  Cardiovascular:     Rate and Rhythm: Normal rate and regular rhythm.  Pulmonary:     Effort: Pulmonary effort is normal. No respiratory distress.  Abdominal:     General: There is no distension.     Tenderness: There is no abdominal tenderness.  Skin:    General: Skin is warm and dry.  Neurological:     General: No focal deficit  present.     Mental Status: He is alert.    ED Results / Procedures / Treatments   Labs (all labs ordered are listed, but only abnormal results are displayed) Labs Reviewed  RESP PANEL BY RT-PCR (FLU A&B, COVID) ARPGX2 - Abnormal; Notable for the following components:      Result Value   SARS Coronavirus 2 by RT PCR POSITIVE (*)    All other components within normal limits  COMPREHENSIVE METABOLIC PANEL - Abnormal; Notable for the following components:   Glucose, Bld 115 (*)    BUN 27 (*)    Creatinine, Ser 1.38 (*)    GFR, Estimated 50 (*)    All other components within normal limits  CBC WITH DIFFERENTIAL/PLATELET - Abnormal; Notable for the following components:   WBC 10.6 (*)    RBC 4.13 (*)    Neutro Abs 8.7 (*)    All other components within normal limits  CULTURE, BLOOD (ROUTINE X 2)  CULTURE, BLOOD (ROUTINE X 2)  LACTIC ACID, PLASMA  PROTIME-INR  APTT  URINALYSIS, ROUTINE W REFLEX  MICROSCOPIC  TROPONIN I (HIGH SENSITIVITY)    EKG EKG Interpretation  Date/Time:  Sunday April 03 2021 14:38:36 EST Ventricular Rate:  75 PR Interval:  144 QRS Duration: 81 QT Interval:  384 QTC Calculation: 429 R Axis:   20 Text Interpretation: Sinus rhythm RSR' in V1 or V2, probably normal variant Confirmed by Alvester Chou [id number]) on 04/03/2021 2:44:17 PM  Radiology DG Chest Port 1 View  Result Date: 04/03/2021 CLINICAL DATA:  Sepsis. EXAM: PORTABLE CHEST 1 VIEW COMPARISON:  None. FINDINGS: The heart and mediastinal contours are within normal limits. Aortic calcification No focal consolidation. No pulmonary edema. No pleural effusion. No pneumothorax. No acute osseous abnormality.  Right shoulder degenerative changes. IMPRESSION: No active disease. Electronically Signed   By: Tish Frederickson M.D.   On: 04/03/2021 15:18    Procedures Procedures   Medications Ordered in ED Medications  ziprasidone (GEODON) injection 20 mg (20 mg Intramuscular Given 04/03/21 1543)  sterile water (preservative free) injection 2 mL (2 mLs Injection Given 04/03/21 1543)    ED Course  I have reviewed the triage vital signs and the nursing notes.  Pertinent labs & imaging results that were available during my care of the patient were reviewed by me and considered in my medical decision making (see chart for details).  Patient with fairly significant dementia, intermittent aggressive behavior, here with issues with stumbling or weaker gait at his facility.  Work-up today was notable for COVID-positive.  This is likely the cause of his symptoms.  He has a mild low-grade fever here.  Otherwise no leukocytosis, no localizing nidus of infection per my review and interpretation of his labs and imaging.  UA and chest x-ray without evidence of infection.  I doubt sepsis.  Lactate is within normal limits.  No evidence of significant dehydration versus clinical labs.  Blood pressures are stable.  Troponin  is unremarkable.  I doubt this is ACS.  EKG per my interpretation at baseline, no acute ischemic findings.  I did speak to his wife by phone and provided an update regarding his work-up  Clinical Course as of 04/03/21 2118  Wynelle Link Apr 03, 2021  1556 Patient is quite aggressive with his dementia.  Twice he crawled out of bed and had to be brought back into bed by staff.  He is extremely noncompliant with the work-up, does not seem to understand why he is here.  He was given Geodon so we can complete the work-up.  He was brought out of the room into the hallway for direct observation by the nursing staff. [MT]  1858 Covid (+).  Suspect this is cause of symptoms.  Doubt sepsis.  No hypoxia.  Would discharge back to facility.  Attempting to reach family [MT]    Clinical Course User Index [MT] Harry Bark, Kermit Balo, MD    Final Clinical Impression(s) / ED Diagnoses Final diagnoses:  COVID-19    Rx / DC Orders ED Discharge Orders          Ordered    nirmatrelvir/ritonavir EUA (PAXLOVID) 20 x 150 MG & 10 x 100MG  TABS  2 times daily        04/03/21 1907             04/05/21, MD 04/03/21 2120

## 2021-04-08 LAB — CULTURE, BLOOD (ROUTINE X 2)
Culture: NO GROWTH
Special Requests: ADEQUATE

## 2022-03-18 ENCOUNTER — Other Ambulatory Visit: Payer: Self-pay

## 2022-03-18 ENCOUNTER — Emergency Department (HOSPITAL_COMMUNITY): Payer: Medicare PPO

## 2022-03-18 ENCOUNTER — Emergency Department (HOSPITAL_COMMUNITY)
Admission: EM | Admit: 2022-03-18 | Discharge: 2022-03-19 | Disposition: A | Discharge status: Home / Self Care | Payer: Medicare PPO | Attending: Emergency Medicine | Admitting: Emergency Medicine

## 2022-03-18 ENCOUNTER — Encounter (HOSPITAL_COMMUNITY): Payer: Self-pay

## 2022-03-18 DIAGNOSIS — F03911 Unspecified dementia, unspecified severity, with agitation: Secondary | ICD-10-CM | POA: Diagnosis not present

## 2022-03-18 DIAGNOSIS — Z1152 Encounter for screening for COVID-19: Secondary | ICD-10-CM | POA: Insufficient documentation

## 2022-03-18 DIAGNOSIS — W19XXXA Unspecified fall, initial encounter: Secondary | ICD-10-CM | POA: Diagnosis not present

## 2022-03-18 DIAGNOSIS — R109 Unspecified abdominal pain: Secondary | ICD-10-CM | POA: Diagnosis not present

## 2022-03-18 DIAGNOSIS — F0390 Unspecified dementia without behavioral disturbance: Secondary | ICD-10-CM | POA: Diagnosis present

## 2022-03-18 DIAGNOSIS — R197 Diarrhea, unspecified: Secondary | ICD-10-CM | POA: Diagnosis not present

## 2022-03-18 DIAGNOSIS — R1110 Vomiting, unspecified: Secondary | ICD-10-CM | POA: Diagnosis not present

## 2022-03-18 DIAGNOSIS — R531 Weakness: Secondary | ICD-10-CM | POA: Insufficient documentation

## 2022-03-18 DIAGNOSIS — R410 Disorientation, unspecified: Secondary | ICD-10-CM | POA: Insufficient documentation

## 2022-03-18 LAB — CBC WITH DIFFERENTIAL/PLATELET
Abs Immature Granulocytes: 0.04 10*3/uL (ref 0.00–0.07)
Basophils Absolute: 0 10*3/uL (ref 0.0–0.1)
Basophils Relative: 0 %
Eosinophils Absolute: 0 10*3/uL (ref 0.0–0.5)
Eosinophils Relative: 0 %
HCT: 40.9 % (ref 39.0–52.0)
Hemoglobin: 13.8 g/dL (ref 13.0–17.0)
Immature Granulocytes: 0 %
Lymphocytes Relative: 5 %
Lymphs Abs: 0.5 10*3/uL — ABNORMAL LOW (ref 0.7–4.0)
MCH: 33.3 pg (ref 26.0–34.0)
MCHC: 33.7 g/dL (ref 30.0–36.0)
MCV: 98.8 fL (ref 80.0–100.0)
Monocytes Absolute: 1.1 10*3/uL — ABNORMAL HIGH (ref 0.1–1.0)
Monocytes Relative: 11 %
Neutro Abs: 8.4 10*3/uL — ABNORMAL HIGH (ref 1.7–7.7)
Neutrophils Relative %: 84 %
Platelets: 79 10*3/uL — ABNORMAL LOW (ref 150–400)
RBC: 4.14 MIL/uL — ABNORMAL LOW (ref 4.22–5.81)
RDW: 13.2 % (ref 11.5–15.5)
WBC: 10.1 10*3/uL (ref 4.0–10.5)
nRBC: 0 % (ref 0.0–0.2)

## 2022-03-18 LAB — URINALYSIS, ROUTINE W REFLEX MICROSCOPIC
Bacteria, UA: NONE SEEN
Bilirubin Urine: NEGATIVE
Glucose, UA: NEGATIVE mg/dL
Ketones, ur: NEGATIVE mg/dL
Leukocytes,Ua: NEGATIVE
Nitrite: NEGATIVE
Protein, ur: 100 mg/dL — AB
Specific Gravity, Urine: 1.028 (ref 1.005–1.030)
pH: 5 (ref 5.0–8.0)

## 2022-03-18 LAB — COMPREHENSIVE METABOLIC PANEL
ALT: 22 U/L (ref 0–44)
AST: 49 U/L — ABNORMAL HIGH (ref 15–41)
Albumin: 4 g/dL (ref 3.5–5.0)
Alkaline Phosphatase: 50 U/L (ref 38–126)
Anion gap: 6 (ref 5–15)
BUN: 32 mg/dL — ABNORMAL HIGH (ref 8–23)
CO2: 23 mmol/L (ref 22–32)
Calcium: 8.7 mg/dL — ABNORMAL LOW (ref 8.9–10.3)
Chloride: 110 mmol/L (ref 98–111)
Creatinine, Ser: 1.26 mg/dL — ABNORMAL HIGH (ref 0.61–1.24)
GFR, Estimated: 56 mL/min — ABNORMAL LOW (ref >60)
Glucose, Bld: 120 mg/dL — ABNORMAL HIGH (ref 70–99)
Potassium: 3.6 mmol/L (ref 3.5–5.1)
Sodium: 139 mmol/L (ref 135–145)
Total Bilirubin: 0.6 mg/dL (ref 0.3–1.2)
Total Protein: 7.4 g/dL (ref 6.5–8.1)

## 2022-03-18 MED ORDER — LORAZEPAM 2 MG/ML IJ SOLN
1.0000 mg | Freq: Once | INTRAMUSCULAR | Status: DC
  Filled 2022-03-18: qty 1

## 2022-03-18 MED ORDER — HALOPERIDOL LACTATE 5 MG/ML IJ SOLN
5.0000 mg | Freq: Once | INTRAMUSCULAR | Status: AC
  Administered 2022-03-18: 5 mg via INTRAVENOUS
  Filled 2022-03-18: qty 1

## 2022-03-18 NOTE — ED Provider Notes (Signed)
[facility]-EMERGENCY DEPT Provider Note   CSN: [id number] Arrival date & time: 03/18/22  1606     History  Chief Complaint  Patient presents with   Timothy Robbins is a 88 y.o. male.  Patient presents to the emergency department via EMS from [facility] due to having multiple falls over the past few days.  He is also reported to have altered mental status at this time.  The patient has history of dementia EMS reported no obvious injuries upon their arrival.  The patient currently endorses no complaints.  He is alert to person and place but is unaware of any falls, believes it is 1920 something, and thinks that Timothy Robbins is the president.  Past medical history significant for history of altered mental status, reported dementia  HPI     Home Medications Prior to Admission medications   Medication Sig Start Date End Date Taking? Authorizing Provider  Desmopressin Acetate (NOCDURNA) 55.3 MCG SUBL Place 1 tablet under the tongue at bedtime. Take 1 hour prior to bedtime Patient not taking: Reported on 01/13/2021 12/16/17   Riki Altes, MD  diclofenac Sodium (VOLTAREN) 1 % GEL Apply 4 g topically 4 (four) times daily. 03/24/20   Nita Sickle, MD  predniSONE (DELTASONE) 10 MG tablet Take 6 tabs day 1, 5 tabs day 2, 4 tabs day 3, 3 tabs day 4, 2 tabs day 5, 1 tab day 6 Patient not taking: Reported on 01/13/2021 11/23/19   Lorre Munroe, NP      Allergies    Patient has no known allergies.    Review of Systems   Review of Systems  Reason unable to perform ROS: Patient altered, denies any complaints.    Physical Exam Updated Vital Signs BP 134/62   Pulse 70   Temp 97.9 F (36.6 C) (Oral)   Resp 16   SpO2 95%  Physical Exam Vitals and nursing note reviewed.  Constitutional:      General: He is not in acute distress.    Appearance: He is well-developed.  HENT:     Head: Normocephalic and atraumatic.     Nose: Nose normal.      Mouth/Throat:     Mouth: Mucous membranes are moist.  Eyes:     Conjunctiva/sclera: Conjunctivae normal.     Pupils: Pupils are equal, round, and reactive to light.  Cardiovascular:     Rate and Rhythm: Normal rate and regular rhythm.     Heart sounds: No murmur heard. Pulmonary:     Effort: Pulmonary effort is normal. No respiratory distress.     Breath sounds: Normal breath sounds.  Abdominal:     Palpations: Abdomen is soft.     Tenderness: There is no abdominal tenderness.  Musculoskeletal:        General: No swelling, tenderness, deformity or signs of injury.     Cervical back: Normal range of motion and neck supple. No rigidity or tenderness.  Skin:    General: Skin is warm and dry.     Capillary Refill: Capillary refill takes less than 2 seconds.  Neurological:     General: No focal deficit present.     Mental Status: He is alert. He is disoriented.  Psychiatric:        Mood and Affect: Mood normal.     ED Results / Procedures / Treatments   Labs (all labs ordered are listed, but only abnormal results are displayed) Labs Reviewed  CBC  WITH DIFFERENTIAL/PLATELET - Abnormal; Notable for the following components:      Result Value   RBC 4.14 (*)    Platelets 79 (*)    Neutro Abs 8.4 (*)    Lymphs Abs 0.5 (*)    Monocytes Absolute 1.1 (*)    All other components within normal limits  COMPREHENSIVE METABOLIC PANEL - Abnormal; Notable for the following components:   Glucose, Bld 120 (*)    BUN 32 (*)    Creatinine, Ser 1.26 (*)    Calcium 8.7 (*)    AST 49 (*)    GFR, Estimated 56 (*)    All other components within normal limits  URINALYSIS, ROUTINE W REFLEX MICROSCOPIC - Abnormal; Notable for the following components:   Hgb urine dipstick SMALL (*)    Protein, ur 100 (*)    All other components within normal limits  CBG MONITORING, ED    EKG None  Radiology CT Head Wo Contrast  Result Date: 03/18/2022 CLINICAL DATA:  Mental status change, unknown cause;  Neck trauma (Age >= 65y). Multiple falls, dementia. EXAM: CT HEAD WITHOUT CONTRAST CT CERVICAL SPINE WITHOUT CONTRAST TECHNIQUE: Multidetector CT imaging of the head and cervical spine was performed following the standard protocol without intravenous contrast. Multiplanar CT image reconstructions of the cervical spine were also generated. RADIATION DOSE REDUCTION: This exam was performed according to the departmental dose-optimization program which includes automated exposure control, adjustment of the mA and/or kV according to patient size and/or use of iterative reconstruction technique. COMPARISON:  CT head 01/12/2021 FINDINGS: CT HEAD FINDINGS Brain: Normal anatomic configuration. Parenchymal volume loss is commensurate with the patient's age. Mild periventricular white matter changes are present likely reflecting the sequela of small vessel ischemia. No abnormal intra or extra-axial mass lesion or fluid collection. No abnormal mass effect or midline shift. No evidence of acute intracranial hemorrhage or infarct. Ventricular size is normal. Cerebellum unremarkable. Vascular: No asymmetric hyperdense vasculature at the skull base. Skull: Intact Sinuses/Orbits: Paranasal sinuses are clear. Orbits are unremarkable. Other: Mastoid air cells and middle ear cavities are clear. CT CERVICAL SPINE FINDINGS Alignment: Normal. Skull base and vertebrae: Craniocervical alignment is normal. The atlantodental interval is not widened. There is ankylosis of the facet joints of C2-C5 and the vertebral bodies of C3-C5. No acute fracture of the cervical spine. Vertebral body height is preserved. Soft tissues and spinal canal: No prevertebral fluid or swelling. No visible canal hematoma. Disc levels: There is marked intervertebral disc space narrowing and endplate remodeling of the residual disc spaces at C5-C7 in keeping with changes of advanced degenerative disc disease. Prevertebral soft tissues are not thickened on sagittal  reformats. Spinal canal is widely patent. There is multilevel moderate to severe neuroforaminal narrowing throughout the cervical spine, most severe at C5-6 and C6-7 bilaterally. Upper chest: Negative. Other: None IMPRESSION: 1. No acute intracranial abnormality. No calvarial fracture. 2. No acute fracture or listhesis of the cervical spine. 3. 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 Electronically Signed   By: Helyn Numbers M.D.   On: 03/18/2022 21:49   CT Cervical Spine Wo Contrast  Result Date: 03/18/2022 CLINICAL DATA:  Mental status change, unknown cause; Neck trauma (Age >= 65y). Multiple falls, dementia. EXAM: CT HEAD WITHOUT CONTRAST CT CERVICAL SPINE WITHOUT CONTRAST TECHNIQUE: Multidetector CT imaging of the head and cervical spine was performed following the standard protocol without  intravenous contrast. Multiplanar CT image reconstructions of the cervical spine were also generated. RADIATION DOSE REDUCTION: This exam was performed according to the departmental dose-optimization program which includes automated exposure control, adjustment of the mA and/or kV according to patient size and/or use of iterative reconstruction technique. COMPARISON:  CT head 01/12/2021 FINDINGS: CT HEAD FINDINGS Brain: Normal anatomic configuration. Parenchymal volume loss is commensurate with the patient's age. Mild periventricular white matter changes are present likely reflecting the sequela of small vessel ischemia. No abnormal intra or extra-axial mass lesion or fluid collection. No abnormal mass effect or midline shift. No evidence of acute intracranial hemorrhage or infarct. Ventricular size is normal. Cerebellum unremarkable. Vascular: No asymmetric hyperdense vasculature at the skull base. Skull: Intact Sinuses/Orbits: Paranasal sinuses are clear. Orbits are  unremarkable. Other: Mastoid air cells and middle ear cavities are clear. CT CERVICAL SPINE FINDINGS Alignment: Normal. Skull base and vertebrae: Craniocervical alignment is normal. The atlantodental interval is not widened. There is ankylosis of the facet joints of C2-C5 and the vertebral bodies of C3-C5. No acute fracture of the cervical spine. Vertebral body height is preserved. Soft tissues and spinal canal: No prevertebral fluid or swelling. No visible canal hematoma. Disc levels: There is marked intervertebral disc space narrowing and endplate remodeling of the residual disc spaces at C5-C7 in keeping with changes of advanced degenerative disc disease. Prevertebral soft tissues are not thickened on sagittal reformats. Spinal canal is widely patent. There is multilevel moderate to severe neuroforaminal narrowing throughout the cervical spine, most severe at C5-6 and C6-7 bilaterally. Upper chest: Negative. Other: None IMPRESSION: 1. No acute intracranial abnormality. No calvarial fracture. 2. No acute fracture or listhesis of the cervical spine. 3. 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 Electronically Signed   By: Helyn Numbers M.D.   On: 03/18/2022 21:49   DG Chest 2 View  Result Date: 03/18/2022 CLINICAL DATA:  Altered mental status. Multiple falls over the last several days. EXAM: CHEST - 2 VIEW COMPARISON:  04/03/2021. FINDINGS: Cardiac silhouette is normal in size. No mediastinal or hilar masses or evidence of adenopathy. Clear lungs.  No pleural effusion or pneumothorax. Skeletal structures are intact. IMPRESSION: No active cardiopulmonary disease. Electronically Signed   By: Amie Portland M.D.   On: 03/18/2022 17:13    Procedures Procedures    Medications Ordered in ED Medications  LORazepam (ATIVAN) injection 1 mg (1 mg Intravenous Not  Given 03/18/22 2025)  haloperidol lactate (HALDOL) injection 5 mg (5 mg Intravenous Given 03/18/22 2019)    ED Course/ Medical Decision Making/ A&P Clinical Course as of 03/18/22 2154  Sat Mar 18, 2022  2017 Patient agitated, refusing CT scans.  Patient does not appear to have capacity to make his own medical decisions at this time.  Haldol ordered. [LM]    Clinical Course User Index [LM] Pamala Duffel                           Medical Decision Making Amount and/or Complexity of Data Reviewed Labs: ordered. Radiology: ordered.  Risk Prescription drug management.   This patient presents to the ED for concern of falls and increased agitation, this involves an extensive number of treatment options, and is a complaint that carries with it a high risk of complications and morbidity.  The differential diagnosis includes intracranial abnormality, fracture,  dislocation, UTI, electrolyte disturbance, dementia, and others   Co morbidities that complicate the patient evaluation  Dementia   Additional history obtained:  Additional history obtained from memory care staff External records from outside source obtained and reviewed including emergency [facility] where the patient was evaluated for similar mental status changes with no acute cause found, attributed to worsening dementia   Lab Tests:  I Ordered, and personally interpreted labs.  The pertinent results include: Unremarkable CBC, creatinine 1.26 consistent with patient's baseline, unremarkable UA   Imaging Studies ordered:  I ordered imaging studies including chest x-ray, CT head, CT cervical spine I independently visualized and interpreted imaging which showed no active disease on chest x-ray. 1. No acute intracranial abnormality. No calvarial fracture.  2. No acute fracture or listhesis of the cervical spine.  3. Advanced degenerative disc and degenerative joint  disease within  the cervical spine resulting in multilevel moderate to severe  neuroforaminal narrowing, most severe at C5-6 and C6-7 bilaterally.   I agree with the radiologist interpretation  Problem List / ED Course / Critical interventions / Medication management  I ordered medication including Haldol for agitation Reevaluation of the patient after these medicines showed that the patient improved I have reviewed the patients home medicines and have made adjustments as needed   Social Determinants of Health:  Patient lives in a memory care facility   Test / Admission - Considered:  There were no acute findings on CT exam.  I see no obvious signs of injury from patient's reported falls.  Work-up was unremarkable for any acute cause of worsening agitation.  This is likely worsening dementia.  There is no indication at this time for admission or further work-up.  Plan to discharge patient back to memory care facility.        Final Clinical Impression(s) / ED Diagnoses Final diagnoses:  Fall, initial encounter  Agitation due to dementia [facility](HCC)    Rx / DC Orders ED Discharge Orders     None         Pamala DuffelMcCauley, Tilford Deaton B, PA-C 03/18/22 2154    Rolan BuccoBelfi, Melanie, MD 03/18/22 2219

## 2022-03-18 NOTE — Discharge Instructions (Addendum)
You were seen today for evaluation after a fall. No acute injury or abnormality was noted on exam, imaging, or with lab work. Please follow up with primary care for further evaluation as necessary of worsening dementia

## 2022-03-18 NOTE — ED Notes (Addendum)
Pt refuse for cardiac monitoring attempted to put 5 lead on, pt  has had a bad attitude with NT advised I am here to help him let the RN know as well

## 2022-03-18 NOTE — ED Triage Notes (Signed)
EMS reports from [facility] on [place], called out for multiple falls over last few days, and AMS. Hx of dementia. No obvious injuries.  BP 148/78 HR 70 RR 20 Sp02 97 RA CBG 159  20ga L forearm

## 2022-03-19 ENCOUNTER — Emergency Department (HOSPITAL_COMMUNITY)
Admission: EM | Admit: 2022-03-19 | Discharge: 2022-03-20 | Disposition: A | Discharge status: Skilled Nursing Facility | Payer: Medicare PPO | Source: Home / Self Care | Attending: Emergency Medicine | Admitting: Emergency Medicine

## 2022-03-19 ENCOUNTER — Emergency Department (HOSPITAL_COMMUNITY): Payer: Medicare PPO

## 2022-03-19 DIAGNOSIS — Z20822 Contact with and (suspected) exposure to covid-19: Secondary | ICD-10-CM | POA: Insufficient documentation

## 2022-03-19 DIAGNOSIS — R109 Unspecified abdominal pain: Secondary | ICD-10-CM | POA: Insufficient documentation

## 2022-03-19 DIAGNOSIS — R1110 Vomiting, unspecified: Secondary | ICD-10-CM | POA: Insufficient documentation

## 2022-03-19 DIAGNOSIS — R531 Weakness: Secondary | ICD-10-CM

## 2022-03-19 DIAGNOSIS — N289 Disorder of kidney and ureter, unspecified: Secondary | ICD-10-CM

## 2022-03-19 DIAGNOSIS — F0390 Unspecified dementia without behavioral disturbance: Secondary | ICD-10-CM | POA: Insufficient documentation

## 2022-03-19 DIAGNOSIS — R197 Diarrhea, unspecified: Secondary | ICD-10-CM | POA: Insufficient documentation

## 2022-03-19 LAB — COMPREHENSIVE METABOLIC PANEL
ALT: 52 U/L — ABNORMAL HIGH (ref 0–44)
AST: 105 U/L — ABNORMAL HIGH (ref 15–41)
Albumin: 4.1 g/dL (ref 3.5–5.0)
Alkaline Phosphatase: 52 U/L (ref 38–126)
Anion gap: 11 (ref 5–15)
BUN: 39 mg/dL — ABNORMAL HIGH (ref 8–23)
CO2: 20 mmol/L — ABNORMAL LOW (ref 22–32)
Calcium: 9.1 mg/dL (ref 8.9–10.3)
Chloride: 108 mmol/L (ref 98–111)
Creatinine, Ser: 1.47 mg/dL — ABNORMAL HIGH (ref 0.61–1.24)
GFR, Estimated: 46 mL/min — ABNORMAL LOW (ref >60)
Glucose, Bld: 106 mg/dL — ABNORMAL HIGH (ref 70–99)
Potassium: 3.5 mmol/L (ref 3.5–5.1)
Sodium: 139 mmol/L (ref 135–145)
Total Bilirubin: 0.3 mg/dL (ref 0.3–1.2)
Total Protein: 7.3 g/dL (ref 6.5–8.1)

## 2022-03-19 LAB — CBC WITH DIFFERENTIAL/PLATELET
Abs Immature Granulocytes: 0.04 10*3/uL (ref 0.00–0.07)
Basophils Absolute: 0 10*3/uL (ref 0.0–0.1)
Basophils Relative: 0 %
Eosinophils Absolute: 0 10*3/uL (ref 0.0–0.5)
Eosinophils Relative: 0 %
HCT: 41.6 % (ref 39.0–52.0)
Hemoglobin: 13.7 g/dL (ref 13.0–17.0)
Immature Granulocytes: 0 %
Lymphocytes Relative: 7 %
Lymphs Abs: 0.7 10*3/uL (ref 0.7–4.0)
MCH: 32.1 pg (ref 26.0–34.0)
MCHC: 32.9 g/dL (ref 30.0–36.0)
MCV: 97.4 fL (ref 80.0–100.0)
Monocytes Absolute: 1.2 10*3/uL — ABNORMAL HIGH (ref 0.1–1.0)
Monocytes Relative: 13 %
Neutro Abs: 7.4 10*3/uL (ref 1.7–7.7)
Neutrophils Relative %: 80 %
Platelets: 106 10*3/uL — ABNORMAL LOW (ref 150–400)
RBC: 4.27 MIL/uL (ref 4.22–5.81)
RDW: 13.3 % (ref 11.5–15.5)
WBC: 9.3 10*3/uL (ref 4.0–10.5)
nRBC: 0 % (ref 0.0–0.2)

## 2022-03-19 LAB — URINALYSIS, ROUTINE W REFLEX MICROSCOPIC
Bilirubin Urine: NEGATIVE
Glucose, UA: NEGATIVE mg/dL
Hgb urine dipstick: NEGATIVE
Ketones, ur: NEGATIVE mg/dL
Leukocytes,Ua: NEGATIVE
Nitrite: NEGATIVE
Protein, ur: 100 mg/dL — AB
Specific Gravity, Urine: 1.027 (ref 1.005–1.030)
pH: 5 (ref 5.0–8.0)

## 2022-03-19 LAB — TROPONIN I (HIGH SENSITIVITY)
Troponin I (High Sensitivity): 19 ng/L — ABNORMAL HIGH (ref <18)
Troponin I (High Sensitivity): 20 ng/L — ABNORMAL HIGH (ref <18)

## 2022-03-19 LAB — SARS CORONAVIRUS 2 BY RT PCR: SARS Coronavirus 2 by RT PCR: NEGATIVE

## 2022-03-19 LAB — LIPASE, BLOOD: Lipase: 32 U/L (ref 11–51)

## 2022-03-19 MED ORDER — IOHEXOL 350 MG/ML SOLN
100.0000 mL | Freq: Once | INTRAVENOUS | Status: AC | PRN
  Administered 2022-03-19: 100 mL via INTRAVENOUS

## 2022-03-19 MED ORDER — ONDANSETRON HCL 4 MG/2ML IJ SOLN
4.0000 mg | Freq: Once | INTRAMUSCULAR | Status: AC
  Administered 2022-03-19: 4 mg via INTRAVENOUS
  Filled 2022-03-19: qty 2

## 2022-03-19 MED ORDER — SODIUM CHLORIDE 0.9 % IV BOLUS
1000.0000 mL | Freq: Once | INTRAVENOUS | Status: AC
  Administered 2022-03-19: 1000 mL via INTRAVENOUS

## 2022-03-19 NOTE — Discharge Instructions (Addendum)
You have mild dehydration for likely a stomach virus.  Your COVID test is negative.  We performed extensive testing including another CT of your head and neck and also CT of your chest and abdomen that were unremarkable.  You have an incidental lesion in your kidney that can be follow-up with your doctor  Please keep him hydrated.  See your doctor this week   Return to ER if you have worse weakness, diarrhea, unable to walk, another fall

## 2022-03-19 NOTE — ED Notes (Signed)
Busy signal  fast inknown if number accurate

## 2022-03-19 NOTE — ED Provider Triage Note (Signed)
Emergency Medicine Provider Triage Evaluation Note  Timothy Robbins , Robbins 88 y.o. male  was evaluated in triage.  Pt complains of N/V/D x 3 days. Fall yesterday seen here yesterday dc back to facility. Still having N/V/D. No recent falls since discharge.  Timothy Robbins, Memory care  Review of Systems  Positive: N/V/D Negative:   Physical Exam  There were no vitals taken for this visit. Gen:   Awake, no distress   Resp:  Normal effort  MSK:   Moves extremities without difficulty  Other:    Medical Decision Making  Medically screening exam initiated at 10:19 AM.  Appropriate orders placed.  Timothy Robbins was informed that the remainder of the evaluation will be completed by another provider, this initial triage assessment does not replace that evaluation, and the importance of remaining in the ED until their evaluation is complete.  N/V/D   Timothy Mckenny A, PA-C 03/19/22 1024

## 2022-03-19 NOTE — ED Notes (Signed)
I attempted to call report to the nursing home it rang busy each time

## 2022-03-19 NOTE — ED Notes (Signed)
[facility] called unable to give pick up time 

## 2022-03-19 NOTE — ED Provider Notes (Signed)
[facility] EMERGENCY DEPARTMENT Provider Note   CSN: [id number] Arrival date & time: 03/19/22  1019     History  No chief complaint on file.   Timothy Robbins is a 88 y.o. male here presenting with abdominal pain and vomiting and weakness.  Patient was seen yesterday for altered mental status at [facility].  Patient required sedation to get a CT head and cervical spine which were unremarkable.  Patient also had unremarkable labs and urinalysis.  Patient was sent back to the facility.  This morning he was too weak to walk and has some diarrhea.  Patient also may have an syncopal episode as well.  He cannot give me any details.  Apparently facility found him too weak to walk and more altered again so sent here for another evaluation.  Apparently he had another fall but he cannot tell me the details.  The history is provided by the patient.       Home Medications Prior to Admission medications   Medication Sig Start Date End Date Taking? Authorizing Provider  Desmopressin Acetate (NOCDURNA) 55.3 MCG SUBL Place 1 tablet under the tongue at bedtime. Take 1 hour prior to bedtime Patient not taking: Reported on 01/13/2021 12/16/17   Riki Altes, MD  diclofenac Sodium (VOLTAREN) 1 % GEL Apply 4 g topically 4 (four) times daily. 03/24/20   Nita Sickle, MD  predniSONE (DELTASONE) 10 MG tablet Take 6 tabs day 1, 5 tabs day 2, 4 tabs day 3, 3 tabs day 4, 2 tabs day 5, 1 tab day 6 Patient not taking: Reported on 01/13/2021 11/23/19   Lorre Munroe, NP      Allergies    Patient has no known allergies.    Review of Systems   Review of Systems  Gastrointestinal:  Positive for diarrhea.  Neurological:  Positive for syncope.  All other systems reviewed and are negative.   Physical Exam Updated Vital Signs BP 132/77   Pulse 68   Temp 98.2 F (36.8 C) (Oral)   Resp 15   SpO2 95%  Physical Exam Vitals and nursing note reviewed.  Constitutional:       Comments: Chronically ill, dehydrated  HENT:     Head: Normocephalic.     Comments: No obvious scalp hematoma    Nose: Nose normal.     Mouth/Throat:     Mouth: Mucous membranes are dry.  Eyes:     Extraocular Movements: Extraocular movements intact.     Pupils: Pupils are equal, round, and reactive to light.  Cardiovascular:     Rate and Rhythm: Normal rate and regular rhythm.     Pulses: Normal pulses.     Heart sounds: Normal heart sounds.  Pulmonary:     Effort: Pulmonary effort is normal.     Breath sounds: Normal breath sounds.  Abdominal:     General: Abdomen is flat.     Palpations: Abdomen is soft.  Musculoskeletal:        General: Normal range of motion.     Cervical back: Normal range of motion and neck supple.  Skin:    General: Skin is warm.     Capillary Refill: Capillary refill takes less than 2 seconds.  Neurological:     Comments: Demented, moving all extremities.  Strength is 4 out of 5 bilateral arms and legs  Psychiatric:        Mood and Affect: Mood normal.  Behavior: Behavior normal.     ED Results / Procedures / Treatments   Labs (all labs ordered are listed, but only abnormal results are displayed) Labs Reviewed  CBC WITH DIFFERENTIAL/PLATELET - Abnormal; Notable for the following components:      Result Value   Platelets 106 (*)    Monocytes Absolute 1.2 (*)    All other components within normal limits  COMPREHENSIVE METABOLIC PANEL - Abnormal; Notable for the following components:   CO2 20 (*)    Glucose, Bld 106 (*)    BUN 39 (*)    Creatinine, Ser 1.47 (*)    AST 105 (*)    ALT 52 (*)    GFR, Estimated 46 (*)    All other components within normal limits  SARS CORONAVIRUS 2 BY RT PCR  LIPASE, BLOOD  URINALYSIS, ROUTINE W REFLEX MICROSCOPIC  TROPONIN I (HIGH SENSITIVITY)    EKG None  Radiology CT Head Wo Contrast  Result Date: 03/18/2022 CLINICAL DATA:  Mental status change, unknown cause; Neck trauma (Age >= 65y).  Multiple falls, dementia. EXAM: CT HEAD WITHOUT CONTRAST CT CERVICAL SPINE WITHOUT CONTRAST TECHNIQUE: Multidetector CT imaging of the head and cervical spine was performed following the standard protocol without intravenous contrast. Multiplanar CT image reconstructions of the cervical spine were also generated. RADIATION DOSE REDUCTION: This exam was performed according to the departmental dose-optimization program which includes automated exposure control, adjustment of the mA and/or kV according to patient size and/or use of iterative reconstruction technique. COMPARISON:  CT head 01/12/2021 FINDINGS: CT HEAD FINDINGS Brain: Normal anatomic configuration. Parenchymal volume loss is commensurate with the patient's age. Mild periventricular white matter changes are present likely reflecting the sequela of small vessel ischemia. No abnormal intra or extra-axial mass lesion or fluid collection. No abnormal mass effect or midline shift. No evidence of acute intracranial hemorrhage or infarct. Ventricular size is normal. Cerebellum unremarkable. Vascular: No asymmetric hyperdense vasculature at the skull base. Skull: Intact Sinuses/Orbits: Paranasal sinuses are clear. Orbits are unremarkable. Other: Mastoid air cells and middle ear cavities are clear. CT CERVICAL SPINE FINDINGS Alignment: Normal. Skull base and vertebrae: Craniocervical alignment is normal. The atlantodental interval is not widened. There is ankylosis of the facet joints of C2-C5 and the vertebral bodies of C3-C5. No acute fracture of the cervical spine. Vertebral body height is preserved. Soft tissues and spinal canal: No prevertebral fluid or swelling. No visible canal hematoma. Disc levels: There is marked intervertebral disc space narrowing and endplate remodeling of the residual disc spaces at C5-C7 in keeping with changes of advanced degenerative disc disease. Prevertebral soft tissues are not thickened on sagittal reformats. Spinal canal is  widely patent. There is multilevel moderate to severe neuroforaminal narrowing throughout the cervical spine, most severe at C5-6 and C6-7 bilaterally. Upper chest: Negative. Other: None IMPRESSION: 1. No acute intracranial abnormality. No calvarial fracture. 2. No acute fracture or listhesis of the cervical spine. 3. 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 Electronically Signed   By: Helyn Numbers M.D.   On: 03/18/2022 21:49   CT Cervical Spine Wo Contrast  Result Date: 03/18/2022 CLINICAL DATA:  Mental status change, unknown cause; Neck trauma (Age >= 65y). Multiple falls, dementia. EXAM: CT HEAD WITHOUT CONTRAST CT CERVICAL SPINE WITHOUT CONTRAST TECHNIQUE: Multidetector CT imaging of the head and cervical spine was performed following the standard protocol without intravenous contrast.  Multiplanar CT image reconstructions of the cervical spine were also generated. RADIATION DOSE REDUCTION: This exam was performed according to the departmental dose-optimization program which includes automated exposure control, adjustment of the mA and/or kV according to patient size and/or use of iterative reconstruction technique. COMPARISON:  CT head 01/12/2021 FINDINGS: CT HEAD FINDINGS Brain: Normal anatomic configuration. Parenchymal volume loss is commensurate with the patient's age. Mild periventricular white matter changes are present likely reflecting the sequela of small vessel ischemia. No abnormal intra or extra-axial mass lesion or fluid collection. No abnormal mass effect or midline shift. No evidence of acute intracranial hemorrhage or infarct. Ventricular size is normal. Cerebellum unremarkable. Vascular: No asymmetric hyperdense vasculature at the skull base. Skull: Intact Sinuses/Orbits: Paranasal sinuses are clear. Orbits are unremarkable. Other:  Mastoid air cells and middle ear cavities are clear. CT CERVICAL SPINE FINDINGS Alignment: Normal. Skull base and vertebrae: Craniocervical alignment is normal. The atlantodental interval is not widened. There is ankylosis of the facet joints of C2-C5 and the vertebral bodies of C3-C5. No acute fracture of the cervical spine. Vertebral body height is preserved. Soft tissues and spinal canal: No prevertebral fluid or swelling. No visible canal hematoma. Disc levels: There is marked intervertebral disc space narrowing and endplate remodeling of the residual disc spaces at C5-C7 in keeping with changes of advanced degenerative disc disease. Prevertebral soft tissues are not thickened on sagittal reformats. Spinal canal is widely patent. There is multilevel moderate to severe neuroforaminal narrowing throughout the cervical spine, most severe at C5-6 and C6-7 bilaterally. Upper chest: Negative. Other: None IMPRESSION: 1. No acute intracranial abnormality. No calvarial fracture. 2. No acute fracture or listhesis of the cervical spine. 3. 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 Electronically Signed   By: Helyn NumbersAshesh  Parikh M.D.   On: 03/18/2022 21:49   DG Chest 2 View  Result Date: 03/18/2022 CLINICAL DATA:  Altered mental status. Multiple falls over the last several days. EXAM: CHEST - 2 VIEW COMPARISON:  04/03/2021. FINDINGS: Cardiac silhouette is normal in size. No mediastinal or hilar masses or evidence of adenopathy. Clear lungs.  No pleural effusion or pneumothorax. Skeletal structures are intact. IMPRESSION: No active cardiopulmonary disease. Electronically Signed   By: Amie Portlandavid  Ormond M.D.   On: 03/18/2022 17:13    Procedures Procedures    Medications Ordered in ED Medications  sodium chloride 0.9 % bolus 1,000 mL (1,000 mLs Intravenous New Bag/Given  03/19/22 1750)  ondansetron (ZOFRAN) injection 4 mg (4 mg Intravenous Given 03/19/22 1750)    ED Course/ Medical Decision Making/ A&P                           Medical Decision Making Timothy LegatoJames D Burck is a 88 y.o. male here presenting with confusion and weakness.  Patient was seen yesterday for possible fall and confusion.  Patient apparently had another fall and now has diarrhea.  I think likely viral gastroenteritis.  Given potential syncope we will get a CTA chest to rule out PE.  We will also get CT abdomen pelvis.  Patient may have had head injury so we will get a CT head and cervical spine as well.  We will check labs and hydrate and reassess.  8:03 PM I reviewed patient's labs and independently interpreted imaging studies.  COVID test is negative.  Patient's creatinine is 1.4 and  was 1.2 yesterday.  CT showed no PE and patient had possible renal lesions.  I think clinically patient has gastroenteritis.  Patient was given IV fluids and felt better.  I encouraged him to stay hydrated.  He has no vomiting in the ED.  Problems Addressed: Diarrhea, unspecified type: acute illness or injury Weakness: acute illness or injury  Amount and/or Complexity of Data Reviewed Labs: ordered. Decision-making details documented in ED Course. Radiology: ordered and independent interpretation performed. Decision-making details documented in ED Course. ECG/medicine tests: ordered and independent interpretation performed. Decision-making details documented in ED Course.  Risk Prescription drug management.    Final Clinical Impression(s) / ED Diagnoses Final diagnoses:  None    Rx / DC Orders ED Discharge Orders     None         Charlynne Pander, MD 03/19/22 2004

## 2022-03-19 NOTE — ED Triage Notes (Signed)
Pt bib ems from [facility] memory care with nausea vomiting and diarrhea X3 days. Having worsening of symptoms since. Alert to self only. Pt with more fatigue. VSS. No orthostatic changes noted.  118/76 HR 66 97% CBG 130 98.86F

## 2022-03-19 NOTE — ED Notes (Signed)
[facility] here to transport back   family member gave me a jumbner to call and I was able to give  report to a staff ,member

## 2023-01-15 ENCOUNTER — Emergency Department (HOSPITAL_COMMUNITY): Payer: Medicare PPO

## 2023-01-15 ENCOUNTER — Emergency Department (HOSPITAL_COMMUNITY)
Admission: EM | Admit: 2023-01-15 | Discharge: 2023-01-15 | Disposition: A | Discharge status: Home / Self Care | Payer: Medicare PPO | Attending: Emergency Medicine | Admitting: Emergency Medicine

## 2023-01-15 ENCOUNTER — Encounter (HOSPITAL_COMMUNITY): Payer: Self-pay

## 2023-01-15 DIAGNOSIS — Z8546 Personal history of malignant neoplasm of prostate: Secondary | ICD-10-CM | POA: Insufficient documentation

## 2023-01-15 DIAGNOSIS — R059 Cough, unspecified: Secondary | ICD-10-CM | POA: Diagnosis present

## 2023-01-15 DIAGNOSIS — F0390 Unspecified dementia without behavioral disturbance: Secondary | ICD-10-CM | POA: Diagnosis not present

## 2023-01-15 DIAGNOSIS — M25559 Pain in unspecified hip: Secondary | ICD-10-CM | POA: Diagnosis not present

## 2023-01-15 DIAGNOSIS — J189 Pneumonia, unspecified organism: Secondary | ICD-10-CM | POA: Insufficient documentation

## 2023-01-15 DIAGNOSIS — Z7952 Long term (current) use of systemic steroids: Secondary | ICD-10-CM | POA: Diagnosis not present

## 2023-01-15 DIAGNOSIS — W19XXXA Unspecified fall, initial encounter: Secondary | ICD-10-CM | POA: Insufficient documentation

## 2023-01-15 DIAGNOSIS — R001 Bradycardia, unspecified: Secondary | ICD-10-CM | POA: Diagnosis not present

## 2023-01-15 DIAGNOSIS — Z20822 Contact with and (suspected) exposure to covid-19: Secondary | ICD-10-CM | POA: Diagnosis not present

## 2023-01-15 LAB — CBC WITH DIFFERENTIAL/PLATELET
Abs Immature Granulocytes: 0.03 10*3/uL (ref 0.00–0.07)
Basophils Absolute: 0 10*3/uL (ref 0.0–0.1)
Basophils Relative: 0 %
Eosinophils Absolute: 0 10*3/uL (ref 0.0–0.5)
Eosinophils Relative: 0 %
HCT: 43.2 % (ref 39.0–52.0)
Hemoglobin: 14.1 g/dL (ref 13.0–17.0)
Immature Granulocytes: 0 %
Lymphocytes Relative: 13 %
Lymphs Abs: 1.4 10*3/uL (ref 0.7–4.0)
MCH: 31.9 pg (ref 26.0–34.0)
MCHC: 32.6 g/dL (ref 30.0–36.0)
MCV: 97.7 fL (ref 80.0–100.0)
Monocytes Absolute: 1 10*3/uL (ref 0.1–1.0)
Monocytes Relative: 9 %
Neutro Abs: 8.6 10*3/uL — ABNORMAL HIGH (ref 1.7–7.7)
Neutrophils Relative %: 78 %
Platelets: 154 10*3/uL (ref 150–400)
RBC: 4.42 MIL/uL (ref 4.22–5.81)
RDW: 13 % (ref 11.5–15.5)
WBC: 11.1 10*3/uL — ABNORMAL HIGH (ref 4.0–10.5)
nRBC: 0 % (ref 0.0–0.2)

## 2023-01-15 LAB — COMPREHENSIVE METABOLIC PANEL
ALT: 18 U/L (ref 0–44)
AST: 50 U/L — ABNORMAL HIGH (ref 15–41)
Albumin: 4.2 g/dL (ref 3.5–5.0)
Alkaline Phosphatase: 72 U/L (ref 38–126)
Anion gap: 9 (ref 5–15)
BUN: 37 mg/dL — ABNORMAL HIGH (ref 8–23)
CO2: 28 mmol/L (ref 22–32)
Calcium: 9.6 mg/dL (ref 8.9–10.3)
Chloride: 104 mmol/L (ref 98–111)
Creatinine, Ser: 1.17 mg/dL (ref 0.61–1.24)
GFR, Estimated: >60 mL/min (ref >60)
Glucose, Bld: 100 mg/dL — ABNORMAL HIGH (ref 70–99)
Potassium: 4 mmol/L (ref 3.5–5.1)
Sodium: 141 mmol/L (ref 135–145)
Total Bilirubin: 0.7 mg/dL (ref 0.3–1.2)
Total Protein: 7.4 g/dL (ref 6.5–8.1)

## 2023-01-15 LAB — SARS CORONAVIRUS 2 BY RT PCR: SARS Coronavirus 2 by RT PCR: NEGATIVE

## 2023-01-15 MED ORDER — AMOXICILLIN-POT CLAVULANATE 875-125 MG PO TABS: 1.0000 | ORAL_TABLET | Freq: Two times a day (BID) | ORAL | 0 refills | Status: DC

## 2023-01-15 MED ORDER — AMOXICILLIN-POT CLAVULANATE 875-125 MG PO TABS
1.0000 | ORAL_TABLET | Freq: Once | ORAL | Status: AC
  Administered 2023-01-15: 1 via ORAL
  Filled 2023-01-15: qty 1

## 2023-01-15 NOTE — ED Notes (Signed)
Called [facility] for transport patient to [facility]

## 2023-01-15 NOTE — ED Triage Notes (Signed)
BIB [facility] from [facility] memory care unit for unwitnessed fall. Unknown down time found inside bathroom on the floor. Denies blood thinners No obvious injuries or complaints

## 2023-01-15 NOTE — ED Notes (Signed)
Called report to [facility] [employer] [facility] at [facility]

## 2023-01-15 NOTE — Discharge Instructions (Addendum)
All blood work looks normal today.  Patient was able to stand and walk and has no pain.  However he did have a mild cough today and imaging shows possible early pneumonia versus lung nodule.  Will treat with antibiotics but he needs a repeat CAT scan of his lungs in 6 months for recheck.  If he starts having shortness of breath, worsening confusion, recurrent falls, low blood pressure or hypoxia he should be sent back to the emergency room.

## 2023-01-15 NOTE — ED Provider Notes (Signed)
[place] EMERGENCY DEPARTMENT AT [facility] Provider Note   CSN: [id number] Arrival date & time: 01/15/23  4098     History  Chief Complaint  Patient presents with   Timothy Robbins is a 89 y.o. male.  Patient is an 89 year old male with a history of prior prostate cancer, hyperlipidemia, dementia who lives in a memory care unit is being brought in today after finding him on the floor in his bathroom.  The fall was unwitnessed and they are unclear how long he was on the floor.  To them he complained of hip pain but had no other complaints.  Patient does not take any anticoagulation.  I spoke with Lawson Fiscal at the facility who reports she was not there over the weekend but on Friday he was his normal self and today he seemed at his baseline prior to leaving the facility.  When interviewing the patient he said that they are conspiring against him and he did not fall and he was not on the floor in the bathroom and he is not having any problems right now.  He denies any pain anywhere, shortness of breath, abdominal pain, nausea or vomiting.  The history is provided by the patient, the nursing home and the EMS personnel.  Fall       Home Medications Prior to Admission medications   Medication Sig Start Date End Date Taking? Authorizing Provider  amoxicillin-clavulanate (AUGMENTIN) 875-125 MG tablet Take 1 tablet by mouth every 12 (twelve) hours. 01/15/23  Yes Gwyneth Sprout, MD  Desmopressin Acetate (NOCDURNA) 55.3 MCG SUBL Place 1 tablet under the tongue at bedtime. Take 1 hour prior to bedtime Patient not taking: Reported on 01/13/2021 12/16/17   Riki Altes, MD  diclofenac Sodium (VOLTAREN) 1 % GEL Apply 4 g topically 4 (four) times daily. 03/24/20   Nita Sickle, MD  predniSONE (DELTASONE) 10 MG tablet Take 6 tabs day 1, 5 tabs day 2, 4 tabs day 3, 3 tabs day 4, 2 tabs day 5, 1 tab day 6 Patient not taking: Reported on 01/13/2021 11/23/19   Lorre Munroe, NP       Allergies    Patient has no known allergies.    Review of Systems   Review of Systems  Physical Exam Updated Vital Signs BP 120/76 (BP Location: Right Arm)   Pulse 64   Temp 98.3 F (36.8 C) (Oral)   Resp 18   SpO2 98%  Physical Exam Vitals and nursing note reviewed.  Constitutional:      General: He is not in acute distress.    Appearance: He is well-developed.  HENT:     Head: Normocephalic and atraumatic.  Eyes:     Conjunctiva/sclera: Conjunctivae normal.     Pupils: Pupils are equal, round, and reactive to light.  Cardiovascular:     Rate and Rhythm: Regular rhythm. Bradycardia present.     Heart sounds: No murmur heard. Pulmonary:     Effort: Pulmonary effort is normal. No respiratory distress.     Breath sounds: Normal breath sounds. No wheezing or rales.  Abdominal:     General: There is no distension.     Palpations: Abdomen is soft.     Tenderness: There is no abdominal tenderness. There is no guarding or rebound.  Musculoskeletal:        General: No tenderness. Normal range of motion.     Cervical back: Normal range of motion and neck supple.  Skin:  General: Skin is warm and dry.     Findings: No erythema or rash.  Neurological:     General: No focal deficit present.     Mental Status: He is alert. Mental status is at baseline.     Sensory: No sensory deficit.     Motor: No weakness.     Comments: Oriented to person.  Shuffling cautious gait but able to walk with little help  Psychiatric:        Behavior: Behavior normal.     ED Results / Procedures / Treatments   Labs (all labs ordered are listed, but only abnormal results are displayed) Labs Reviewed  CBC WITH DIFFERENTIAL/PLATELET - Abnormal; Notable for the following components:      Result Value   WBC 11.1 (*)    Neutro Abs 8.6 (*)    All other components within normal limits  COMPREHENSIVE METABOLIC PANEL - Abnormal; Notable for the following components:   Glucose, Bld 100 (*)     BUN 37 (*)    AST 50 (*)    All other components within normal limits  SARS CORONAVIRUS 2 BY RT PCR    EKG EKG Interpretation Date/Time:  Monday January 15 2023 09:57:59 EDT Ventricular Rate:  55 PR Interval:  186 QRS Duration:  84 QT Interval:  461 QTC Calculation: 441 R Axis:   33  Text Interpretation: Sinus rhythm Paired ventricular premature complexes Abnormal R-wave progression, early transition Nonspecific T abnormalities, lateral leads No significant change since last tracing Confirmed by Gwyneth Sprout ([id number]) on 01/15/2023 10:10:22 AM  Radiology CT Chest Wo Contrast  Result Date: 01/15/2023 CLINICAL DATA:  Respiratory illness, nondiagnostic xray EXAM: CT CHEST WITHOUT CONTRAST TECHNIQUE: Multidetector CT imaging of the chest was performed following the standard protocol without IV contrast. RADIATION DOSE REDUCTION: This exam was performed according to the departmental dose-optimization program which includes automated exposure control, adjustment of the mA and/or kV according to patient size and/or use of iterative reconstruction technique. COMPARISON:  Chest radiograph from earlier today. 03/19/2022 chest CT angiogram. FINDINGS: Cardiovascular: Normal heart size. No significant pericardial effusion/thickening. Three-vessel coronary atherosclerosis. Atherosclerotic nonaneurysmal thoracic aorta. Normal caliber pulmonary arteries. Mediastinum/Nodes: No significant thyroid nodules. Fluid level in the otherwise normal thoracic esophagus. No axillary adenopathy. Mildly enlarged 1.0 cm AP window node (series 2/image 24), new. No additional pathologically enlarged mediastinal nodes. No discrete hilar adenopathy on these noncontrast images. Lungs/Pleura: No pneumothorax. No pleural effusion. No acute consolidative airspace disease or lung masses. Ground-glass 1.7 cm peripheral right upper lobe pulmonary nodule (series 4/image 73), new from 03/19/2022 chest CT. Chronic patchy peripheral  centrilobular micronodularity throughout the bilateral lower lobes, much of which is calcified, not definitely changed from prior chest CT, although is comparison is limited by hypoventilatory change on the prior CT. Associated chronic minimal cylindrical bronchiolectasis in the posterior basilar lower lobes. Representative 0.4 cm posterior left lower lobe indistinct noncalcified nodule (series 4/image 116), not definitely changed. Upper abdomen: Small hiatal hernia. Musculoskeletal: No aggressive appearing focal osseous lesions. Symmetric mild bilateral gynecomastia, similar. Moderate thoracic spondylosis. IMPRESSION: 1. Ground-glass 1.7 cm peripheral right upper lobe pulmonary nodule, new from 03/19/2022 chest CT, favoring infectious or inflammatory nodule. Suggest attention on follow-up noncontrast chest CT in 6 months. 2. Chronic patchy peripheral centrilobular micronodularity throughout the bilateral lower lobes, much of which is calcified, not definitely changed from prior chest CT, although is comparison is limited by hypoventilatory change on the prior CT. Associated chronic minimal cylindrical bronchiolectasis in the posterior  basilar lower lobes. Findings are most compatible with chronic postinflammatory change such as from remote aspiration. 3. New mild AP window lymphadenopathy, nonspecific, which can also be assessed on follow-up chest CT in 6 months. 4. Three-vessel coronary atherosclerosis. 5. Small hiatal hernia. 6. Fluid level in the otherwise normal thoracic esophagus, suggesting esophageal dysmotility and/or gastroesophageal reflux. 7.  Aortic Atherosclerosis (ICD10-I70.0). Electronically Signed   By: Delbert Phenix M.D.   On: 01/15/2023 13:40   DG Chest Port 1 View  Result Date: 01/15/2023 CLINICAL DATA:  Provided history: Fall at home.  Unwitnessed fall. EXAM: PORTABLE CHEST 1 VIEW COMPARISON:  Chest CT 03/19/2022. Prior chest radiographs 03/18/2022 and earlier. FINDINGS: Heart size within  normal limits. Prominence of the interstitial markings at the lung bases (right greater than left). No evidence of pleural effusion or pneumothorax. No acute osseous abnormality identified. Degenerative changes of the spine. IMPRESSION: Prominence of the interstitial markings within the lung bases (right greater than left), which may reflect an infectious/inflammatory process and/or fibrotic changes. Consider a chest CT for further characterization. Electronically Signed   By: Jackey Loge D.O.   On: 01/15/2023 10:36    Procedures Procedures    Medications Ordered in ED Medications - No data to display  ED Course/ Medical Decision Making/ A&P                                 Medical Decision Making Amount and/or Complexity of Data Reviewed Labs: ordered. Decision-making details documented in ED Course. Radiology: ordered and independent interpretation performed. Decision-making details documented in ED Course. ECG/medicine tests: ordered and independent interpretation performed.  Risk Prescription drug management.   Pt with multiple medical problems and comorbidities and presenting today with a complaint that caries a high risk for morbidity and mortality.  Patient here after being found on the floor this morning at memory care.  Unclear how long he was down and initially complained of left hip pain which she denies currently.  Patient was able to get out of the bed and walk and denied any pain and low suspicion for hip or pelvic fracture at this time.  Patient is able to raise each of his legs independently has no pronator drift in the upper or lower extremities.  No facial drooping is noted.  He is denying any symptoms but does have a mild cough on exam.  Vital signs are reassuring with normal blood pressure, oxygen saturation.  Patient does have bradycardia in the 50s but looking on prior notes back to 2021 that is not unusual.  Do not feel that patient needs any imaging out fracture and has  no signs of injury to the head or neck.  Will do lab work and a COVID test to ensure there is no underlying reason patient fell.  Speaking with his memory care unit his last fall was 3 to 4 months ago.  CT of the chest with report of a 1.7 cm peripheral right upper lobe pulmonary nodule which is new but they are favoring infection as well as new mild lymphadenopathy which is nonspecific.  They do recommend chest CT in 6 months for reevaluation but given patient's minimal white count, new mild cough and a fall will treat for possible pneumonia.  At this time sats are 98% he has been in no acute distress and feel that he is stable to return to his facility.  Will treat with Augmentin.  Final Clinical Impression(s) / ED Diagnoses Final diagnoses:  Fall, initial encounter  Community acquired pneumonia of right upper lobe of lung    Rx / DC Orders ED Discharge Orders          Ordered    amoxicillin-clavulanate (AUGMENTIN) 875-125 MG tablet  Every 12 hours        01/15/23 1352              Gwyneth Sprout, MD 01/15/23 1353

## 2023-03-07 ENCOUNTER — Emergency Department (HOSPITAL_COMMUNITY): Payer: Medicare PPO

## 2023-03-07 ENCOUNTER — Emergency Department (HOSPITAL_COMMUNITY)
Admission: EM | Admit: 2023-03-07 | Discharge: 2023-03-07 | Disposition: A | Discharge status: Home / Self Care | Payer: Medicare PPO | Attending: Emergency Medicine | Admitting: Emergency Medicine

## 2023-03-07 DIAGNOSIS — W19XXXA Unspecified fall, initial encounter: Secondary | ICD-10-CM | POA: Insufficient documentation

## 2023-03-07 DIAGNOSIS — J181 Lobar pneumonia, unspecified organism: Secondary | ICD-10-CM | POA: Insufficient documentation

## 2023-03-07 DIAGNOSIS — F0390 Unspecified dementia without behavioral disturbance: Secondary | ICD-10-CM | POA: Insufficient documentation

## 2023-03-07 DIAGNOSIS — R0602 Shortness of breath: Secondary | ICD-10-CM | POA: Diagnosis present

## 2023-03-07 DIAGNOSIS — J189 Pneumonia, unspecified organism: Secondary | ICD-10-CM

## 2023-03-07 LAB — URINALYSIS, W/ REFLEX TO CULTURE (INFECTION SUSPECTED)
Bacteria, UA: NONE SEEN
Bilirubin Urine: NEGATIVE
Glucose, UA: NEGATIVE mg/dL
Hgb urine dipstick: NEGATIVE
Ketones, ur: NEGATIVE mg/dL
Leukocytes,Ua: NEGATIVE
Nitrite: NEGATIVE
Protein, ur: NEGATIVE mg/dL
Specific Gravity, Urine: 1.025 (ref 1.005–1.030)
pH: 5 (ref 5.0–8.0)

## 2023-03-07 LAB — COMPREHENSIVE METABOLIC PANEL
ALT: 12 U/L (ref 0–44)
AST: 22 U/L (ref 15–41)
Albumin: 3.7 g/dL (ref 3.5–5.0)
Alkaline Phosphatase: 58 U/L (ref 38–126)
Anion gap: 5 (ref 5–15)
BUN: 32 mg/dL — ABNORMAL HIGH (ref 8–23)
CO2: 28 mmol/L (ref 22–32)
Calcium: 8.7 mg/dL — ABNORMAL LOW (ref 8.9–10.3)
Chloride: 106 mmol/L (ref 98–111)
Creatinine, Ser: 1.33 mg/dL — ABNORMAL HIGH (ref 0.61–1.24)
GFR, Estimated: 52 mL/min — ABNORMAL LOW (ref >60)
Glucose, Bld: 84 mg/dL (ref 70–99)
Potassium: 4 mmol/L (ref 3.5–5.1)
Sodium: 139 mmol/L (ref 135–145)
Total Bilirubin: 0.7 mg/dL (ref 0.3–1.2)
Total Protein: 6.6 g/dL (ref 6.5–8.1)

## 2023-03-07 LAB — CBC WITH DIFFERENTIAL/PLATELET
Abs Immature Granulocytes: 0.04 10*3/uL (ref 0.00–0.07)
Basophils Absolute: 0 10*3/uL (ref 0.0–0.1)
Basophils Relative: 0 %
Eosinophils Absolute: 0.1 10*3/uL (ref 0.0–0.5)
Eosinophils Relative: 1 %
HCT: 37.9 % — ABNORMAL LOW (ref 39.0–52.0)
Hemoglobin: 12.2 g/dL — ABNORMAL LOW (ref 13.0–17.0)
Immature Granulocytes: 0 %
Lymphocytes Relative: 18 %
Lymphs Abs: 1.8 10*3/uL (ref 0.7–4.0)
MCH: 31.8 pg (ref 26.0–34.0)
MCHC: 32.2 g/dL (ref 30.0–36.0)
MCV: 98.7 fL (ref 80.0–100.0)
Monocytes Absolute: 0.9 10*3/uL (ref 0.1–1.0)
Monocytes Relative: 9 %
Neutro Abs: 7.1 10*3/uL (ref 1.7–7.7)
Neutrophils Relative %: 72 %
Platelets: UNDETERMINED 10*3/uL (ref 150–400)
RBC: 3.84 MIL/uL — ABNORMAL LOW (ref 4.22–5.81)
RDW: 13.1 % (ref 11.5–15.5)
WBC: 10 10*3/uL (ref 4.0–10.5)
nRBC: 0 % (ref 0.0–0.2)

## 2023-03-07 LAB — BRAIN NATRIURETIC PEPTIDE: B Natriuretic Peptide: 70.5 pg/mL (ref 0.0–100.0)

## 2023-03-07 MED ORDER — CEFTRIAXONE SODIUM 1 G IJ SOLR
1.0000 g | Freq: Once | INTRAMUSCULAR | Status: AC
  Administered 2023-03-07: 1 g via INTRAMUSCULAR
  Filled 2023-03-07: qty 10

## 2023-03-07 MED ORDER — AMOXICILLIN-POT CLAVULANATE 875-125 MG PO TABS: 1.0000 | ORAL_TABLET | Freq: Two times a day (BID) | ORAL | 0 refills | Status: AC

## 2023-03-07 MED ORDER — SODIUM CHLORIDE 0.9 % IV SOLN: 1.0000 g | Freq: Once | INTRAVENOUS | Status: DC

## 2023-03-07 MED ORDER — AZITHROMYCIN 250 MG PO TABS: 250.0000 mg | ORAL_TABLET | Freq: Every day | ORAL | 0 refills | Status: AC

## 2023-03-07 MED ORDER — CEPHALEXIN 500 MG PO CAPS
500.0000 mg | ORAL_CAPSULE | Freq: Once | ORAL | Status: AC
  Administered 2023-03-07: 500 mg via ORAL
  Filled 2023-03-07: qty 1

## 2023-03-07 NOTE — Discharge Instructions (Addendum)
You have pneumonia.  I have ordered Augmentin twice daily for a week.  I have also ordered Z-Pak.  Take Tylenol or Motrin for fever  See your doctor for follow-up  Return to ER if he has worse cough or trouble breathing

## 2023-03-07 NOTE — ED Provider Notes (Signed)
[place] EMERGENCY DEPARTMENT AT [facility] Provider Note   CSN: [id number] Arrival date & time: 03/07/23  1258     History  No chief complaint on file.   Timothy Robbins is a 89 y.o. male.  HPI Patient presents via EMS from his nursing facility after staff are concerned about the patient's breathing.  Patient denies any complaints, does have hiccups, notes that he is had them for years.  He does have a history of dementia, cannot provide additional substantial details of his history. EMS reports staff also noted the patient fell yesterday.  Again the patient cannot describe any of this history.    Home Medications Prior to Admission medications   Medication Sig Start Date End Date Taking? Authorizing Provider  amoxicillin-clavulanate (AUGMENTIN) 875-125 MG tablet Take 1 tablet by mouth every 12 (twelve) hours. 01/15/23   Gwyneth Sprout, MD  Desmopressin Acetate (NOCDURNA) 55.3 MCG SUBL Place 1 tablet under the tongue at bedtime. Take 1 hour prior to bedtime Patient not taking: Reported on 01/13/2021 12/16/17   Riki Altes, MD  diclofenac Sodium (VOLTAREN) 1 % GEL Apply 4 g topically 4 (four) times daily. 03/24/20   Nita Sickle, MD  predniSONE (DELTASONE) 10 MG tablet Take 6 tabs day 1, 5 tabs day 2, 4 tabs day 3, 3 tabs day 4, 2 tabs day 5, 1 tab day 6 Patient not taking: Reported on 01/13/2021 11/23/19   Lorre Munroe, NP      Allergies    Patient has no known allergies.    Review of Systems   Review of Systems  Physical Exam Updated Vital Signs BP 108/71 (BP Location: Right Arm)   Pulse (!) 54   Temp 98 F (36.7 C) (Oral)   Resp 15   SpO2 97%  Physical Exam Vitals and nursing note reviewed.  Constitutional:      General: He is not in acute distress.    Appearance: He is well-developed.  HENT:     Head: Normocephalic and atraumatic.  Eyes:     Conjunctiva/sclera: Conjunctivae normal.  Cardiovascular:     Rate and Rhythm: Normal rate  and regular rhythm.  Pulmonary:     Effort: Pulmonary effort is normal. No respiratory distress.     Breath sounds: No stridor.  Abdominal:     General: There is no distension.  Skin:    General: Skin is warm and dry.  Neurological:     Mental Status: He is alert.     Motor: Atrophy present.  Psychiatric:        Cognition and Memory: Cognition is impaired. Memory is impaired.     ED Results / Procedures / Treatments   Labs (all labs ordered are listed, but only abnormal results are displayed) Labs Reviewed  COMPREHENSIVE METABOLIC PANEL  CBC WITH DIFFERENTIAL/PLATELET  BRAIN NATRIURETIC PEPTIDE    EKG None  Radiology No results found.  Procedures Procedures    Medications Ordered in ED Medications - No data to display  ED Course/ Medical Decision Making/ A&P                                 Medical Decision Making Elderly male with dementia presents with staff concerns of breathing abnormality as well as a fall.  Patient has chronic hiccups, this is unchanged, is awake, alert, denying actual complaints, but given the limitations by his dementia, differential including intracranial abnormality status  post fall, infection, pneumonia considered.  Cardiac 55 sinus normal Pulse ox 100% room air normal   Amount and/or Complexity of Data Reviewed Independent Historian: EMS External Data Reviewed: notes. Labs: ordered. Decision-making details documented in ED Course. Radiology: ordered and independent interpretation performed. Decision-making details documented in ED Course. ECG/medicine tests: ordered and independent interpretation performed. Decision-making details documented in ED Course.  Risk Prescription drug management. Decision regarding hospitalization. Diagnosis or treatment significantly limited by social determinants of health.   Update: Patient accompanied by his daughter at bedside.  She notes the patient is interacting in a typical manner for him.  We  discussed his presentation, staff concerns from the nursing facility.  X-ray labs, pending on signout.  No early evidence for bacteremia, sepsis, no evidence for neuro decompensation as well.        Final Clinical Impression(s) / ED Diagnoses Final diagnoses:  SOB (shortness of breath)    Rx / DC Orders ED Discharge Orders     None         Gerhard Munch, MD 03/07/23 1649

## 2023-03-07 NOTE — ED Notes (Signed)
No answer upon attempts to notify facility of pt report and discharge

## 2023-03-07 NOTE — ED Triage Notes (Signed)
Pt arrives EMS from [facility] for rhythmic belly spasms that look and sometimes sound like hiccups. Pt occasionally burps. Noticed this morning. No CP, SHOB. Pt denies pain. Pt did have a fall yesterday, no thinners. 18ga LW VSS

## 2023-03-07 NOTE — ED Notes (Signed)
PTAR called for pt transport to [facility].

## 2023-03-07 NOTE — ED Notes (Signed)
Staff from [facility] called to get an update. Advised staff of pt status. Advised staff to callback once labs and scans have resulted.

## 2023-03-07 NOTE — ED Provider Notes (Signed)
  Physical Exam  BP 106/62 (BP Location: Left Arm)   Pulse (!) 101   Temp 98 F (36.7 C) (Oral)   Resp 17   SpO2 99%   Physical Exam  Procedures  Procedures  ED Course / MDM    Medical Decision Making Care assumed at 3 PM.  Patient is here with tachypnea and cough.  Signout pending chest x-ray.  Patient also has urinary frequency so also pending urinalysis  6:21 PM Chest today showed left lower lobe pneumonia versus atelectasis.  Urinalysis is clear.  Will discharge back to facility with keflex and zpack   Problems Addressed: Community acquired pneumonia of left lower lobe of lung: acute illness or injury SOB (shortness of breath): acute illness or injury  Amount and/or Complexity of Data Reviewed Labs: ordered. Decision-making details documented in ED Course. Radiology: ordered and independent interpretation performed. Decision-making details documented in ED Course.  Risk Prescription drug management.         Charlynne Pander, MD 03/07/23 [phone]

## 2023-03-08 ENCOUNTER — Other Ambulatory Visit (HOSPITAL_COMMUNITY): Payer: Self-pay | Admitting: Emergency Medicine

## 2023-03-08 ENCOUNTER — Ambulatory Visit (HOSPITAL_COMMUNITY)
Admission: RE | Admit: 2023-03-08 | Discharge: 2023-03-08 | Disposition: A | Discharge status: Home / Self Care | Payer: Medicare PPO | Source: Ambulatory Visit | Attending: Emergency Medicine | Admitting: Emergency Medicine

## 2023-03-08 DIAGNOSIS — R0602 Shortness of breath: Secondary | ICD-10-CM | POA: Insufficient documentation

## 2023-03-08 DIAGNOSIS — W19XXXA Unspecified fall, initial encounter: Secondary | ICD-10-CM

## 2023-03-08 DIAGNOSIS — J189 Pneumonia, unspecified organism: Secondary | ICD-10-CM | POA: Diagnosis not present

## 2023-09-03 ENCOUNTER — Other Ambulatory Visit: Payer: Self-pay

## 2023-09-03 ENCOUNTER — Emergency Department (HOSPITAL_COMMUNITY)

## 2023-09-03 ENCOUNTER — Emergency Department (HOSPITAL_COMMUNITY)
Admission: EM | Admit: 2023-09-03 | Discharge: 2023-09-03 | Disposition: A | Discharge status: Home / Self Care | Attending: Emergency Medicine | Admitting: Emergency Medicine

## 2023-09-03 ENCOUNTER — Encounter (HOSPITAL_COMMUNITY): Payer: Self-pay | Admitting: *Deleted

## 2023-09-03 DIAGNOSIS — S59901A Unspecified injury of right elbow, initial encounter: Secondary | ICD-10-CM | POA: Diagnosis present

## 2023-09-03 DIAGNOSIS — F0390 Unspecified dementia without behavioral disturbance: Secondary | ICD-10-CM | POA: Diagnosis not present

## 2023-09-03 DIAGNOSIS — S5001XA Contusion of right elbow, initial encounter: Secondary | ICD-10-CM | POA: Diagnosis not present

## 2023-09-03 DIAGNOSIS — W19XXXA Unspecified fall, initial encounter: Secondary | ICD-10-CM | POA: Diagnosis not present

## 2023-09-03 DIAGNOSIS — R4182 Altered mental status, unspecified: Secondary | ICD-10-CM | POA: Diagnosis not present

## 2023-09-03 LAB — URINALYSIS, ROUTINE W REFLEX MICROSCOPIC
Bilirubin Urine: NEGATIVE
Glucose, UA: NEGATIVE mg/dL
Hgb urine dipstick: NEGATIVE
Ketones, ur: 5 mg/dL — AB
Leukocytes,Ua: NEGATIVE
Nitrite: NEGATIVE
Protein, ur: 30 mg/dL — AB
Specific Gravity, Urine: 1.024 (ref 1.005–1.030)
pH: 5 (ref 5.0–8.0)

## 2023-09-03 LAB — CBC WITH DIFFERENTIAL/PLATELET
Abs Immature Granulocytes: 0.03 10*3/uL (ref 0.00–0.07)
Basophils Absolute: 0 10*3/uL (ref 0.0–0.1)
Basophils Relative: 0 %
Eosinophils Absolute: 0 10*3/uL (ref 0.0–0.5)
Eosinophils Relative: 0 %
HCT: 35.2 % — ABNORMAL LOW (ref 39.0–52.0)
Hemoglobin: 11.5 g/dL — ABNORMAL LOW (ref 13.0–17.0)
Immature Granulocytes: 0 %
Lymphocytes Relative: 14 %
Lymphs Abs: 1.5 10*3/uL (ref 0.7–4.0)
MCH: 31.1 pg (ref 26.0–34.0)
MCHC: 32.7 g/dL (ref 30.0–36.0)
MCV: 95.1 fL (ref 80.0–100.0)
Monocytes Absolute: 1.6 10*3/uL — ABNORMAL HIGH (ref 0.1–1.0)
Monocytes Relative: 14 %
Neutro Abs: 8.2 10*3/uL — ABNORMAL HIGH (ref 1.7–7.7)
Neutrophils Relative %: 72 %
Platelets: 104 10*3/uL — ABNORMAL LOW (ref 150–400)
RBC: 3.7 MIL/uL — ABNORMAL LOW (ref 4.22–5.81)
RDW: 13.7 % (ref 11.5–15.5)
WBC: 11.3 10*3/uL — ABNORMAL HIGH (ref 4.0–10.5)
nRBC: 0 % (ref 0.0–0.2)

## 2023-09-03 LAB — COMPREHENSIVE METABOLIC PANEL WITH GFR
ALT: 12 U/L (ref 0–44)
AST: 23 U/L (ref 15–41)
Albumin: 3.2 g/dL — ABNORMAL LOW (ref 3.5–5.0)
Alkaline Phosphatase: 49 U/L (ref 38–126)
Anion gap: 11 (ref 5–15)
BUN: 26 mg/dL — ABNORMAL HIGH (ref 8–23)
CO2: 23 mmol/L (ref 22–32)
Calcium: 8.8 mg/dL — ABNORMAL LOW (ref 8.9–10.3)
Chloride: 103 mmol/L (ref 98–111)
Creatinine, Ser: 1.2 mg/dL (ref 0.61–1.24)
GFR, Estimated: 59 mL/min — ABNORMAL LOW (ref >60)
Glucose, Bld: 112 mg/dL — ABNORMAL HIGH (ref 70–99)
Potassium: 4.1 mmol/L (ref 3.5–5.1)
Sodium: 137 mmol/L (ref 135–145)
Total Bilirubin: 0.8 mg/dL (ref 0.0–1.2)
Total Protein: 6 g/dL — ABNORMAL LOW (ref 6.5–8.1)

## 2023-09-03 NOTE — ED Provider Notes (Signed)
 [place] [facility] [place] EMERGENCY DEPARTMENT AT [facility] Provider Note   CSN: [id number] Arrival date & time: 09/03/23  0038     History  Chief Complaint  Patient presents with   Timothy Robbins is a 89 y.o. male who is on hospice for dementia [facility].  EMS was called due to patient favoring the right elbow.  Some concern for patient being tachypneic upon EMS arrival with activation of ALS track, however upon ALS truck arrival patient's vital signs and respirations were normal.  No further info about patient's fall.  Accompanied by family member at the bedside at this time. Patient is at his mental status baseline per facility, alert and oriented x 1. HPI     Home Medications Prior to Admission medications   Medication Sig Start Date End Date Taking? Authorizing Provider  acetaminophen  (TYLENOL ) 325 MG tablet Take 650 mg by mouth every 6 (six) hours as needed for mild pain (pain score 1-3) or fever.   Yes [provider]  divalproex (DEPAKOTE SPRINKLE) 125 MG capsule Take 750 mg by mouth every morning. 03/13/23  Yes [provider]  lisinopril (ZESTRIL) 10 MG tablet Take 10 mg by mouth daily. 05/22/23  Yes [provider]  omeprazole (PRILOSEC) 20 MG capsule Take 20 mg by mouth daily.   Yes [provider]  QUEtiapine (SEROQUEL) 25 MG tablet Take 12.5 mg by mouth at bedtime.   Yes [provider]  QUEtiapine (SEROQUEL) 50 MG tablet Take 50 mg by mouth daily as needed (for agitation).   Yes [provider]  traZODone (DESYREL) 50 MG tablet Take 50 mg by mouth at bedtime.   Yes [provider]  amoxicillin -clavulanate (AUGMENTIN ) 875-125 MG tablet Take 1 tablet by mouth every 12 (twelve) hours. Patient not taking: Reported on 09/03/2023 03/07/23   Dalene Duck, MD  azithromycin  (ZITHROMAX ) 250 MG tablet Take 1 tablet (250 mg total) by mouth daily. Take first 2  tablets together, then 1 every day until finished. Patient not taking: Reported on 09/03/2023 03/07/23   Dalene Duck, MD  Desmopressin  Acetate (NOCDURNA ) 55.3 MCG SUBL Place 1 tablet under the tongue at bedtime. Take 1 hour prior to bedtime Patient not taking: Reported on 01/13/2021 12/16/17   Geraline Knapp, MD  diclofenac  Sodium (VOLTAREN ) 1 % GEL Apply 4 g topically 4 (four) times daily. Patient not taking: Reported on 09/03/2023 03/24/20   Isa Manuel, MD  predniSONE  (DELTASONE ) 10 MG tablet Take 6 tabs day 1, 5 tabs day 2, 4 tabs day 3, 3 tabs day 4, 2 tabs day 5, 1 tab day 6 Patient not taking: Reported on 01/13/2021 11/23/19   Carollynn Cirri, NP      Allergies    Patient has no known allergies.    Review of Systems   Review of Systems  Unable to perform ROS: Dementia    Physical Exam Updated Vital Signs BP (!) 98/57   Pulse (!) 51   Temp 98.8 F (37.1 C) (Oral)   Resp 13   SpO2 98%  Physical Exam Vitals and nursing note reviewed.  Constitutional:      Appearance: He is not ill-appearing or toxic-appearing.  HENT:     Head: Normocephalic and atraumatic.     Mouth/Throat:     Mouth: Mucous membranes are moist.     Pharynx: No oropharyngeal exudate or posterior oropharyngeal erythema.  Eyes:  General: Lids are normal. Vision grossly intact.        Right eye: No discharge.        Left eye: No discharge.     Extraocular Movements: Extraocular movements intact.     Conjunctiva/sclera: Conjunctivae normal.     Pupils: Pupils are equal, round, and reactive to light.  Cardiovascular:     Rate and Rhythm: Normal rate and regular rhythm.     Pulses: Normal pulses.     Heart sounds: Normal heart sounds.  Pulmonary:     Effort: Pulmonary effort is normal. No respiratory distress.     Breath sounds: Normal breath sounds. No wheezing or rales.  Abdominal:     General: Bowel sounds are normal. There is no distension.     Palpations: Abdomen is soft.      Tenderness: There is no abdominal tenderness. There is no guarding or rebound.  Musculoskeletal:        General: No deformity.     Cervical back: Neck supple.     Right lower leg: No edema.     Left lower leg: No edema.  Skin:    General: Skin is warm and dry.     Capillary Refill: Capillary refill takes less than 2 seconds.       Neurological:     General: No focal deficit present.     Mental Status: He is alert and oriented to person, place, and time. Mental status is at baseline.  Psychiatric:        Mood and Affect: Mood normal.     ED Results / Procedures / Treatments   Labs (all labs ordered are listed, but only abnormal results are displayed) Labs Reviewed  CBC WITH DIFFERENTIAL/PLATELET - Abnormal; Notable for the following components:      Result Value   WBC 11.3 (*)    RBC 3.70 (*)    Hemoglobin 11.5 (*)    HCT 35.2 (*)    Platelets 104 (*)    Neutro Abs 8.2 (*)    Monocytes Absolute 1.6 (*)    All other components within normal limits  COMPREHENSIVE METABOLIC PANEL WITH GFR - Abnormal; Notable for the following components:   Glucose, Bld 112 (*)    BUN 26 (*)    Calcium 8.8 (*)    Total Protein 6.0 (*)    Albumin 3.2 (*)    GFR, Estimated 59 (*)    All other components within normal limits  URINALYSIS, ROUTINE W REFLEX MICROSCOPIC - Abnormal; Notable for the following components:   Ketones, ur 5 (*)    Protein, ur 30 (*)    Bacteria, UA RARE (*)    All other components within normal limits    EKG None  Radiology CT HEAD WO CONTRAST ( ) Result Date: 09/03/2023 EXAM: CT HEAD WITHOUT 09/03/2023 01:08:36 AM TECHNIQUE: CT of the head was performed without the administration of intravenous contrast. Automated exposure control, iterative reconstruction, and/or weight based adjustment of the mA/kV was utilized to reduce the radiation dose to as low as reasonably achievable. COMPARISON: 03/19/2022 CLINICAL HISTORY: Head trauma, minor (Age >= 65y). Chief  complaints; Fall; CT HEAD WO CONTRAST ( ); Head trauma, minor (Age >= 65y). FINDINGS: BRAIN AND VENTRICLES: There is no acute intracranial hemorrhage, mass effect or midline shift. No abnormal extra-axial fluid collection. The gray-white differentiation is maintained without evidence of an acute infarct. There is no evidence of hydrocephalus. Global cortical and central atrophy. Mild scattered periventricular and subcortical low density,  likely reflecting small vessel ischemic changes. ORBITS: The visualized portion of the orbits demonstrate no acute abnormality. SINUSES: The visualized paranasal sinuses and mastoid air cells demonstrate no acute abnormality. SOFT TISSUES AND SKULL: No acute abnormality of the visualized skull or soft tissues. IMPRESSION: 1. No acute intracranial abnormality. 2. Atrophy with small vessel ischemic changes. Electronically signed by: Zadie Herter MD 09/03/2023 01:12 AM EDT RP Workstation: [id number]   DG Elbow 2 Views Right Result Date: 09/03/2023 EXAM: 1 or 2 VIEW(S) XRAY OF THE RIGHT ELBOW COMPARISON: None available. CLINICAL HISTORY: AMS. Fall; Right elbow bruising. FINDINGS: BONES: No acute fracture or focal osseous lesion. Mild degenerative changes. JOINTS: No joint dislocation. No significant joint effusion. SOFT TISSUES: The soft tissues are unremarkable. Evaluation is limited by obliquity. IMPRESSION: 1. No acute abnormality. Electronically signed by: Zadie Herter MD 09/03/2023 01:00 AM EDT RP Workstation: [id number]   DG Chest Portable 1 View Result Date: 09/03/2023 EXAM: 1 VIEW(S) XRAY OF THE CHEST 09/03/2023 12:57:26 AM COMPARISON: 03/07/2023 CLINICAL HISTORY: AMS. Fall; Right elbow bruising. FINDINGS: LUNGS AND PLEURA: Mild bilateral lober lobe scarring, chronic. No consolidation. No pulmonary edema. No pleural effusion. No pneumothorax. HEART AND MEDIASTINUM: No acute abnormality of the cardiac and mediastinal silhouettes. BONES AND SOFT TISSUES:  Degenerative changes of the right shoulder. No acute osseous abnormality. IMPRESSION: 1. No acute findings. Electronically signed by: Zadie Herter MD 09/03/2023 12:59 AM EDT RP Workstation: [id number]    Procedures Procedures    Medications Ordered in ED Medications - No data to display  ED Course/ Medical Decision Making/ A&P Clinical Course as of 09/03/23 0356  Mon Sep 03, 2023  0046 [id number] [RS]  0116 DG Elbow 2 Views Right [RS]    Clinical Course User Index [RS] Kae Oram, PA-C                                 Medical Decision Making 89 year old male presents with concern for right elbow pain and swelling  Normal vitals on intake.  Cardiopulmonary abdominal signs are benign.  No evidence of traumatic injury to the chest or abdomen.  Bruising and soft tissue swelling over the right posterior elbow without decreased range of motion.  Patient mental status baseline.  Amount and/or Complexity of Data Reviewed Labs: ordered.    Details: CBC with leukocytosis of 13, anemia with hemoglobin of 11.5, CMP unremarkable, UA without evidence of infection.   Radiology: ordered. Decision-making details documented in ED Course.    Details: CT head negative for acute intracranial abnormality.  Plain film of the chest is unremarkable, x-ray of the right elbow without acute osseous abnormality.      Patient sleeping comfortably at this time.  Suspect contusion to the right elbow but clinical concern for emergent underlying condition that would warrant further ED workup or inpatient management is exceedingly low. Timothy Robbins's relative voiced understanding of his medical evaluation and treatment plan. Each of their questions answered to their expressed satisfaction.  Return precautions were given.  Patient is well-appearing, stable, and was discharged in good condition.  This chart was dictated using voice recognition software, Dragon. Despite the best efforts of this provider to  proofread and correct errors, errors may still occur which can change documentation meaning.        Final Clinical Impression(s) / ED Diagnoses Final diagnoses:  Fall, initial encounter    Rx / DC Orders ED Discharge Orders     None  Kae Oram, PA-C 09/03/23 0356    Eldon Greenland, MD 09/03/23 [phone]

## 2023-09-03 NOTE — Discharge Instructions (Addendum)
 Timothy Robbins had a fall today but fortunately did not have any broken bones. He does not have a UTI. He may follow up outpatient.

## 2023-09-03 NOTE — ED Notes (Signed)
 [facility] here to transport back to facility

## 2023-09-03 NOTE — ED Triage Notes (Signed)
 From [facility] with [facility] for fall, guarding noted to right arm. Initial BLS crew noted pt to be tachypneic and belly breathing ,called for ALS truck which reported normal resp rate but warm to the touch. Pt baseline A&Ox1

## 2024-02-01 ENCOUNTER — Encounter (HOSPITAL_COMMUNITY): Payer: Self-pay | Admitting: Emergency Medicine

## 2024-02-01 ENCOUNTER — Emergency Department (HOSPITAL_COMMUNITY)
Admission: EM | Admit: 2024-02-01 | Discharge: 2024-02-02 | Disposition: A | Discharge status: Home / Self Care | Attending: Emergency Medicine | Admitting: Emergency Medicine

## 2024-02-01 ENCOUNTER — Other Ambulatory Visit: Payer: Self-pay

## 2024-02-01 ENCOUNTER — Emergency Department (HOSPITAL_COMMUNITY)

## 2024-02-01 DIAGNOSIS — W19XXXA Unspecified fall, initial encounter: Secondary | ICD-10-CM | POA: Diagnosis not present

## 2024-02-01 DIAGNOSIS — S0990XA Unspecified injury of head, initial encounter: Secondary | ICD-10-CM | POA: Diagnosis present

## 2024-02-01 DIAGNOSIS — Z23 Encounter for immunization: Secondary | ICD-10-CM | POA: Diagnosis not present

## 2024-02-01 DIAGNOSIS — Z79899 Other long term (current) drug therapy: Secondary | ICD-10-CM | POA: Insufficient documentation

## 2024-02-01 DIAGNOSIS — Y92129 Unspecified place in nursing home as the place of occurrence of the external cause: Secondary | ICD-10-CM | POA: Insufficient documentation

## 2024-02-01 DIAGNOSIS — F0390 Unspecified dementia without behavioral disturbance: Secondary | ICD-10-CM | POA: Insufficient documentation

## 2024-02-01 DIAGNOSIS — Z8616 Personal history of COVID-19: Secondary | ICD-10-CM | POA: Diagnosis not present

## 2024-02-01 DIAGNOSIS — S0101XA Laceration without foreign body of scalp, initial encounter: Secondary | ICD-10-CM | POA: Diagnosis not present

## 2024-02-01 LAB — COMPREHENSIVE METABOLIC PANEL WITH GFR
ALT: 11 U/L (ref 0–44)
AST: 26 U/L (ref 15–41)
Albumin: 3.8 g/dL (ref 3.5–5.0)
Alkaline Phosphatase: 73 U/L (ref 38–126)
Anion gap: 11 (ref 5–15)
BUN: 30 mg/dL — ABNORMAL HIGH (ref 8–23)
CO2: 23 mmol/L (ref 22–32)
Calcium: 9 mg/dL (ref 8.9–10.3)
Chloride: 105 mmol/L (ref 98–111)
Creatinine, Ser: 1.32 mg/dL — ABNORMAL HIGH (ref 0.61–1.24)
GFR, Estimated: 52 mL/min — ABNORMAL LOW (ref >60)
Glucose, Bld: 104 mg/dL — ABNORMAL HIGH (ref 70–99)
Potassium: 4.1 mmol/L (ref 3.5–5.1)
Sodium: 138 mmol/L (ref 135–145)
Total Bilirubin: 0.4 mg/dL (ref 0.0–1.2)
Total Protein: 6.3 g/dL — ABNORMAL LOW (ref 6.5–8.1)

## 2024-02-01 LAB — CBC WITH DIFFERENTIAL/PLATELET
Abs Immature Granulocytes: 0.04 K/uL (ref 0.00–0.07)
Basophils Absolute: 0 K/uL (ref 0.0–0.1)
Basophils Relative: 0 %
Eosinophils Absolute: 0.1 K/uL (ref 0.0–0.5)
Eosinophils Relative: 1 %
HCT: 37.4 % — ABNORMAL LOW (ref 39.0–52.0)
Hemoglobin: 11.8 g/dL — ABNORMAL LOW (ref 13.0–17.0)
Immature Granulocytes: 0 %
Lymphocytes Relative: 16 %
Lymphs Abs: 1.6 K/uL (ref 0.7–4.0)
MCH: 30.3 pg (ref 26.0–34.0)
MCHC: 31.6 g/dL (ref 30.0–36.0)
MCV: 96.1 fL (ref 80.0–100.0)
Monocytes Absolute: 1.1 K/uL — ABNORMAL HIGH (ref 0.1–1.0)
Monocytes Relative: 11 %
Neutro Abs: 7.2 K/uL (ref 1.7–7.7)
Neutrophils Relative %: 72 %
Platelets: 109 K/uL — ABNORMAL LOW (ref 150–400)
RBC: 3.89 MIL/uL — ABNORMAL LOW (ref 4.22–5.81)
RDW: 13.6 % (ref 11.5–15.5)
WBC: 10.1 K/uL (ref 4.0–10.5)
nRBC: 0 % (ref 0.0–0.2)

## 2024-02-01 MED ORDER — TETANUS-DIPHTH-ACELL PERTUSSIS 5-2.5-18.5 LF-MCG/0.5 IM SUSY
0.5000 mL | PREFILLED_SYRINGE | Freq: Once | INTRAMUSCULAR | Status: AC
  Administered 2024-02-01: 0.5 mL via INTRAMUSCULAR
  Filled 2024-02-01: qty 0.5

## 2024-02-01 MED ORDER — HALOPERIDOL LACTATE 5 MG/ML IJ SOLN
5.0000 mg | Freq: Once | INTRAMUSCULAR | Status: AC
  Administered 2024-02-01: 5 mg via INTRAVENOUS
  Filled 2024-02-01: qty 1

## 2024-02-01 NOTE — ED Triage Notes (Addendum)
 Patient BIB EMS from ALF ([facility]) unwitnessed fall. Per report patient was found laying on his back beside his bed. Patient sustained 2-2.5 inch laceration in the back of his head.  Patient poor historian. Patient a/ox2. C-Collar on.  Hx Dementia, Patient not taking blood thinners.

## 2024-02-01 NOTE — ED Provider Notes (Incomplete)
 [place] EMERGENCY DEPARTMENT AT [facility] Provider Note   CSN: [id number] Arrival date & time: 02/01/24  2138     Patient presents with: Timothy Robbins is a 89 y.o. male who presents to the ED for evaluation of fall.  Patient does have dementia, is a poor historian.  He does not remember falling this evening..  Denies any pain at the current time.  On exam, does appear to l have a laceration to the back of his head.  He is unsure of his tetanus update.  He arrives in a c-collar.  He apparently fell this evening at his nursing home.  It was an unwitnessed fall.  Patient was found lying on his back beside his bed.  Denies blood thinners, no blood thinners on chart review.   Fall       Prior to Admission medications   Medication Sig Start Date End Date Taking? Authorizing Provider  acetaminophen  (TYLENOL ) 325 MG tablet Take 650 mg by mouth every 6 (six) hours as needed for mild pain (pain score 1-3) or fever.    [provider]  amoxicillin -clavulanate (AUGMENTIN ) 875-125 MG tablet Take 1 tablet by mouth every 12 (twelve) hours. Patient not taking: Reported on 09/03/2023 03/07/23   Patt Alm Macho, MD  azithromycin  (ZITHROMAX ) 250 MG tablet Take 1 tablet (250 mg total) by mouth daily. Take first 2 tablets together, then 1 every day until finished. Patient not taking: Reported on 09/03/2023 03/07/23   Patt Alm Macho, MD  Desmopressin  Acetate (NOCDURNA ) 55.3 MCG SUBL Place 1 tablet under the tongue at bedtime. Take 1 hour prior to bedtime Patient not taking: Reported on 01/13/2021 12/16/17   Twylla Glendia BROCKS, MD  diclofenac  Sodium (VOLTAREN ) 1 % GEL Apply 4 g topically 4 (four) times daily. Patient not taking: Reported on 09/03/2023 03/24/20   Edelmiro Leash, MD  divalproex (DEPAKOTE SPRINKLE) 125 MG capsule Take 750 mg by mouth every morning. 03/13/23   [provider]  lisinopril (ZESTRIL) 10 MG tablet Take 10 mg by mouth daily. 05/22/23    [provider]  omeprazole (PRILOSEC) 20 MG capsule Take 20 mg by mouth daily.    [provider]  predniSONE  (DELTASONE ) 10 MG tablet Take 6 tabs day 1, 5 tabs day 2, 4 tabs day 3, 3 tabs day 4, 2 tabs day 5, 1 tab day 6 Patient not taking: Reported on 01/13/2021 11/23/19   Antonette Angeline ORN, NP  QUEtiapine (SEROQUEL) 25 MG tablet Take 12.5 mg by mouth at bedtime.    [provider]  QUEtiapine (SEROQUEL) 50 MG tablet Take 50 mg by mouth daily as needed (for agitation).    [provider]  traZODone (DESYREL) 50 MG tablet Take 50 mg by mouth at bedtime.    [provider]    Allergies: Patient has no known allergies.    Review of Systems  Updated Vital Signs BP (!) 136/92   Pulse (!) 53   Temp 98 F (36.7 C)   Resp 18   SpO2 98%   Physical Exam  (all labs ordered are listed, but only abnormal results are displayed) Labs Reviewed  RESP PANEL BY RT-PCR (RSV, FLU A&B, COVID)  [id number]  CBC WITH DIFFERENTIAL/PLATELET  COMPREHENSIVE METABOLIC PANEL WITH GFR    EKG: None  Radiology: No results found.  {Document cardiac monitor, telemetry assessment procedure when appropriate:32947} Procedures   Medications Ordered in the ED  haloperidol  lactate (HALDOL ) injection 5 mg (has  no administration in time range)  Tdap (BOOSTRIX) injection 0.5 mL (has no administration in time range)      {Click here for ABCD2, HEART and other calculators REFRESH Note before signing:1}                              Medical Decision Making Amount and/or Complexity of Data Reviewed Labs: ordered. Radiology: ordered.  Risk Prescription drug management.   ***  {Document critical care time when appropriate  Document review of labs and clinical decision tools ie CHADS2VASC2, etc  Document your independent review of radiology images and any outside records  Document your discussion with family members, caretakers and with consultants  Document social  determinants of health affecting pt's care  Document your decision making why or why not admission, treatments were needed:32947:::1}   Final diagnoses:  None    ED Discharge Orders     None

## 2024-02-01 NOTE — ED Provider Notes (Signed)
 [place] [facility]MENT AT [facility] Provider Note   CSN: [id number] Arrival date & time: 02/01/24  2138     Patient presents with: Timothy Robbins is a 89 y.o. male who presents to the ED for evaluation of fall.  Patient does have dementia, is a poor historian.  He does not remember falling this evening..  Denies any pain at the current time.  On exam, does appear to l have a laceration to the back of his head.  He is unsure of his tetanus update.  He arrives in a c-collar.  He apparently fell this evening at his nursing home.  It was an unwitnessed fall.  Patient was found lying on his back beside his bed.  Denies blood thinners, no blood thinners on chart review.   Fall       Prior to Admission medications   Medication Sig Start Date End Date Taking? Authorizing Provider  divalproex (DEPAKOTE SPRINKLE) 125 MG capsule Take 375 mg by mouth every morning. 03/13/23  Yes [provider]  lisinopril (ZESTRIL) 10 MG tablet Take 10 mg by mouth daily. 05/22/23  Yes [provider]  LORazepam  (ATIVAN ) 0.5 MG tablet Take 0.5 mg by mouth in the morning, at noon, and at bedtime.   Yes [provider]  omeprazole (PRILOSEC) 20 MG capsule Take 20 mg by mouth daily.   Yes [provider]  QUEtiapine (SEROQUEL) 25 MG tablet Take 12.5 mg by mouth daily.   Yes [provider]  QUEtiapine (SEROQUEL) 50 MG tablet Take 50 mg by mouth daily as needed (for agitation).   Yes [provider]  traZODone (DESYREL) 50 MG tablet Take 50 mg by mouth daily in the afternoon.   Yes [provider]  amoxicillin -clavulanate (AUGMENTIN ) 875-125 MG tablet Take 1 tablet by mouth every 12 (twelve) hours. Patient not taking: Reported on 09/03/2023 03/07/23   Patt Alm Macho, MD  azithromycin  (ZITHROMAX ) 250 MG tablet Take 1 tablet (250 mg total) by mouth daily. Take first 2 tablets together, then 1 every day until finished. Patient  not taking: Reported on 09/03/2023 03/07/23   Patt Alm Macho, MD  Desmopressin  Acetate (NOCDURNA ) 55.3 MCG SUBL Place 1 tablet under the tongue at bedtime. Take 1 hour prior to bedtime Patient not taking: Reported on 01/13/2021 12/16/17   Twylla Glendia BROCKS, MD  diclofenac  Sodium (VOLTAREN ) 1 % GEL Apply 4 g topically 4 (four) times daily. Patient not taking: Reported on 09/03/2023 03/24/20   Edelmiro Leash, MD  predniSONE  (DELTASONE ) 10 MG tablet Take 6 tabs day 1, 5 tabs day 2, 4 tabs day 3, 3 tabs day 4, 2 tabs day 5, 1 tab day 6 Patient not taking: Reported on 01/13/2021 11/23/19   Antonette Angeline ORN, NP    Allergies: Patient has no known allergies.    Review of Systems  Unable to perform ROS: Dementia (Level 5 caveat)  All other systems reviewed and are negative.   Updated Vital Signs BP 130/62   Pulse (!) 57   Temp 98 F (36.7 C)   Resp 12   SpO2 100%   Physical Exam Vitals and nursing note reviewed.  Constitutional:      General: He is not in acute distress.    Appearance: He is well-developed.  HENT:     Head: Normocephalic and atraumatic.  Eyes:     Conjunctiva/sclera: Conjunctivae normal.  Neck:     Comments: In c-collar Cardiovascular:  Rate and Rhythm: Normal rate and regular rhythm.     Heart sounds: No murmur heard. Pulmonary:     Effort: Pulmonary effort is normal. No respiratory distress.     Breath sounds: Normal breath sounds.  Abdominal:     Palpations: Abdomen is soft.     Tenderness: There is no abdominal tenderness.  Musculoskeletal:        General: No swelling.     Cervical back: Neck supple.  Skin:    General: Skin is warm and dry.     Capillary Refill: Capillary refill takes less than 2 seconds.     Comments: Laceration posterior scalp, 3 cm  Neurological:     Mental Status: He is alert. Mental status is at baseline.  Psychiatric:        Mood and Affect: Mood normal.     (all labs ordered are listed, but only abnormal results are  displayed) Labs Reviewed  CBC WITH DIFFERENTIAL/PLATELET - Abnormal; Notable for the following components:      Result Value   RBC 3.89 (*)    Hemoglobin 11.8 (*)    HCT 37.4 (*)    Platelets 109 (*)    Monocytes Absolute 1.1 (*)    All other components within normal limits  COMPREHENSIVE METABOLIC PANEL WITH GFR - Abnormal; Notable for the following components:   Glucose, Bld 104 (*)    BUN 30 (*)    Creatinine, Ser 1.32 (*)    Total Protein 6.3 (*)    GFR, Estimated 52 (*)    All other components within normal limits  RESP PANEL BY RT-PCR (RSV, FLU A&B, COVID)  [id number]    EKG: EKG Interpretation Date/Time:  Friday February 01 2024 21:51:41 EDT Ventricular Rate:  52 PR Interval:  177 QRS Duration:  87 QT Interval:  451 QTC Calculation: 420 R Axis:   17  Text Interpretation: Sinus rhythm RSR' in V1 or V2, right VCD or RVH Left ventricular hypertrophy No significant change was found Confirmed by Trine Likes [phone]) on 02/02/2024 1:28:43 AM  Radiology: CT Head Wo Contrast Result Date: 02/01/2024 EXAM: CT HEAD AND CERVICAL SPINE 02/01/2024 11:23:00 PM TECHNIQUE: CT of the head and cervical spine was performed without the administration of intravenous contrast. Multiplanar reformatted images are provided for review. Automated exposure control, iterative reconstruction, and/or weight based adjustment of the mA/kV was utilized to reduce the radiation dose to as low as reasonably achievable. COMPARISON: CT cervical spine 03/19/2022 and CT head 09/03/2023. CLINICAL HISTORY: Head trauma, moderate-severe. Fall earlier today. Head/neck trauma. Pt unable to hold still during scans, best images attainable. FINDINGS: CT HEAD BRAIN AND VENTRICLES: No acute intracranial hemorrhage. No mass effect or midline shift. No abnormal extra-axial fluid collection. No evidence of acute infarct. No hydrocephalus. Chronic microvascular ischemia and generalized atrophy. ORBITS: No acute abnormality. SINUSES  AND MASTOIDS: No acute abnormality. SOFT TISSUES AND SKULL: Motion artifact limits sensitivity for subtle fractures. No definite acute fracture. No acute soft tissue abnormality. CT CERVICAL SPINE BONES AND ALIGNMENT: Motion artifact limits sensitivity for subtle fractures. No definite acute fracture or traumatic malalignment. DEGENERATIVE CHANGES: Multilevel advanced spondylosis, disc space height loss, and degenerative endplate changes from C3-C7. Multilevel facet arthropathy with ankylosis of the C2-C5 facets on the right. No severe spinal canal narrowing. SOFT TISSUES: No prevertebral soft tissue swelling. IMPRESSION: 1. No acute intracranial abnormality. 2. No acute cervical spine fracture or traumatic malalignment; evaluation limited by motion artifact. Electronically signed by: Norman Gatlin MD 02/01/2024 11:43  PM EDT RP Workstation: [id number]   CT Cervical Spine Wo Contrast Result Date: 02/01/2024 EXAM: CT HEAD AND CERVICAL SPINE 02/01/2024 11:23:00 PM TECHNIQUE: CT of the head and cervical spine was performed without the administration of intravenous contrast. Multiplanar reformatted images are provided for review. Automated exposure control, iterative reconstruction, and/or weight based adjustment of the mA/kV was utilized to reduce the radiation dose to as low as reasonably achievable. COMPARISON: CT cervical spine 03/19/2022 and CT head 09/03/2023. CLINICAL HISTORY: Head trauma, moderate-severe. Fall earlier today. Head/neck trauma. Pt unable to hold still during scans, best images attainable. FINDINGS: CT HEAD BRAIN AND VENTRICLES: No acute intracranial hemorrhage. No mass effect or midline shift. No abnormal extra-axial fluid collection. No evidence of acute infarct. No hydrocephalus. Chronic microvascular ischemia and generalized atrophy. ORBITS: No acute abnormality. SINUSES AND MASTOIDS: No acute abnormality. SOFT TISSUES AND SKULL: Motion artifact limits sensitivity for subtle fractures. No  definite acute fracture. No acute soft tissue abnormality. CT CERVICAL SPINE BONES AND ALIGNMENT: Motion artifact limits sensitivity for subtle fractures. No definite acute fracture or traumatic malalignment. DEGENERATIVE CHANGES: Multilevel advanced spondylosis, disc space height loss, and degenerative endplate changes from C3-C7. Multilevel facet arthropathy with ankylosis of the C2-C5 facets on the right. No severe spinal canal narrowing. SOFT TISSUES: No prevertebral soft tissue swelling. IMPRESSION: 1. No acute intracranial abnormality. 2. No acute cervical spine fracture or traumatic malalignment; evaluation limited by motion artifact. Electronically signed by: Norman Gatlin MD 02/01/2024 11:43 PM EDT RP Workstation: [id number]    .Laceration Repair  Date/Time: 02/02/2024 2:50 AM  Performed by: Ruthell Lonni FALCON, PA-C Authorized by: Ruthell Lonni FALCON, PA-C   Consent:    Consent obtained:  Verbal   Risks, benefits, and alternatives were discussed: yes     Risks discussed:  Infection and need for additional repair Laceration details:    Location:  Scalp   Scalp location:  Crown   Length (cm):  3 Pre-procedure details:    Preparation:  Imaging obtained to evaluate for foreign bodies Treatment:    Amount of cleaning:  Standard   Irrigation solution:  Sterile water  Skin repair:    Repair method:  Staples   Number of staples:  3 Approximation:    Approximation:  Loose Repair type:    Repair type:  Simple Post-procedure details:    Dressing:  Non-adherent dressing   Procedure completion:  Tolerated with difficulty    Medications Ordered in the ED  haloperidol  lactate (HALDOL ) injection 5 mg (5 mg Intravenous Given 02/01/24 2226)  Tdap (BOOSTRIX) injection 0.5 mL (0.5 mLs Intramuscular Given 02/01/24 2227)   Medical Decision Making Amount and/or Complexity of Data Reviewed Labs: ordered. Radiology: ordered.  Risk Prescription drug management.   This is an 89 year old  male presenting to the ED after an unwitnessed fall.  On exam, he is afebrile and nontachycardic.  His lung sounds are clear bilaterally, there is no hypoxia.  Abdomen soft and compressible.  Neurological examination at baseline.  Posterior scalp laceration 3 cm present.  Patient in c-collar.  Chart reviewed.  Patient has not had tetanus update since 2015.  Tdap updated here tonight.  Given Haldol  for agitation.  EKG collected prior and QT is not prolonged.  Labs collected to include CBC, CMP.  Have also added CT scan of head, cervical spine.  CBC without leukocytosis, baseline hemoglobin.  Metabolic panel without electrolyte derangement, baseline creatinine, GFR 52, anion gap 11.  CT head unremarkable.  CT cervical spine unremarkable.  Patient laceration  repaired per procedure note.  Patient was agitated during laceration repair.  Did require 3 RNs and techs to hold him down and allow for me to staple head.  Patient wound was then wrapped.  At this time patient recommended workable.  We discharged home to facility.  Advised to follow-up with PCP.  Return precautions given.  [facility] called at this time to transport patient home.     Final diagnoses:  Fall, initial encounter  Laceration of scalp, initial encounter    ED Discharge Orders     None          Ruthell Lonni JULIANNA DEVONNA 02/02/24 0253    Trine Raynell Moder, MD 02/02/24 [phone]

## 2024-02-02 LAB — RESP PANEL BY RT-PCR (RSV, FLU A&B, COVID)  RVPGX2
Influenza A by PCR: NEGATIVE
Influenza B by PCR: NEGATIVE
Resp Syncytial Virus by PCR: NEGATIVE
SARS Coronavirus 2 by RT PCR: NEGATIVE

## 2024-02-02 MED ORDER — LORAZEPAM 2 MG/ML IJ SOLN
0.5000 mg | Freq: Once | INTRAMUSCULAR | Status: AC
  Administered 2024-02-02: 0.5 mg via INTRAVENOUS
  Filled 2024-02-02: qty 1

## 2024-02-02 NOTE — ED Notes (Signed)
 1x attempt at calling [facility] without answer

## 2024-02-02 NOTE — Discharge Instructions (Signed)
 It was a pleasure taking part in your care.  As discussed, workup here in the emergency department is reassuring.  CT scan of your head and neck are unremarkable.  You had your tetanus updated in a laceration of the back your head was repaired with 3 staples.  You will need to follow-up in 10 days to have staples removed.  Please return to ED sooner with new symptoms.

## 2024-02-02 NOTE — ED Notes (Signed)
 [facility] requested for transport of Pt back home

## 2024-02-02 NOTE — ED Notes (Addendum)
 Pt stated he wanted to go pee. Pt was informed that he could not get up out of bed due to risk of fall, and offered urinal, however Pt stated I don't want no damn urinal, Ill just piss in the bed. Writer attempted to hold urinal for pt, however Pt grabbed urinal form Clinical research associate and threw it on the floor.
